# Patient Record
Sex: Female | Born: 1975 | Race: White | Hispanic: No | Marital: Married | Smoking: Former smoker
Health system: Southern US, Community
[De-identification: ages and names within clinical notes are randomized; demographics above are authoritative.]

## PROBLEM LIST (undated history)

## (undated) DIAGNOSIS — K219 Gastro-esophageal reflux disease without esophagitis: Secondary | ICD-10-CM

## (undated) DIAGNOSIS — K7689 Other specified diseases of liver: Secondary | ICD-10-CM

## (undated) DIAGNOSIS — R112 Nausea with vomiting, unspecified: Secondary | ICD-10-CM

## (undated) DIAGNOSIS — Z9889 Other specified postprocedural states: Secondary | ICD-10-CM

## (undated) DIAGNOSIS — F32A Depression, unspecified: Secondary | ICD-10-CM

## (undated) DIAGNOSIS — N809 Endometriosis, unspecified: Secondary | ICD-10-CM

## (undated) DIAGNOSIS — F419 Anxiety disorder, unspecified: Secondary | ICD-10-CM

## (undated) DIAGNOSIS — K317 Polyp of stomach and duodenum: Secondary | ICD-10-CM

## (undated) DIAGNOSIS — M1990 Unspecified osteoarthritis, unspecified site: Secondary | ICD-10-CM

## (undated) DIAGNOSIS — R51 Headache: Secondary | ICD-10-CM

## (undated) DIAGNOSIS — N6019 Diffuse cystic mastopathy of unspecified breast: Secondary | ICD-10-CM

## (undated) DIAGNOSIS — F329 Major depressive disorder, single episode, unspecified: Secondary | ICD-10-CM

## (undated) DIAGNOSIS — N83209 Unspecified ovarian cyst, unspecified side: Secondary | ICD-10-CM

## (undated) HISTORY — DX: Endometriosis, unspecified: N80.9

## (undated) HISTORY — DX: Other specified diseases of liver: K76.89

## (undated) HISTORY — PX: TONSILLECTOMY AND ADENOIDECTOMY: SHX28

## (undated) HISTORY — PX: DIAGNOSTIC LAPAROSCOPY: SUR761

## (undated) HISTORY — DX: Diffuse cystic mastopathy of unspecified breast: N60.19

## (undated) HISTORY — PX: MOUTH SURGERY: SHX715

## (undated) HISTORY — DX: Unspecified ovarian cyst, unspecified side: N83.209

## (undated) HISTORY — DX: Polyp of stomach and duodenum: K31.7

---

## 1998-02-23 ENCOUNTER — Emergency Department (HOSPITAL_COMMUNITY): Admission: EM | Admit: 1998-02-23 | Discharge: 1998-02-23 | Payer: Self-pay | Admitting: Emergency Medicine

## 1998-03-03 ENCOUNTER — Encounter: Admission: RE | Admit: 1998-03-03 | Discharge: 1998-06-01 | Payer: Self-pay | Admitting: Family Medicine

## 1998-11-10 ENCOUNTER — Other Ambulatory Visit: Admission: RE | Admit: 1998-11-10 | Discharge: 1998-11-10 | Payer: Self-pay | Admitting: Obstetrics and Gynecology

## 1998-11-30 ENCOUNTER — Ambulatory Visit (HOSPITAL_COMMUNITY): Admission: RE | Admit: 1998-11-30 | Discharge: 1998-11-30 | Payer: Self-pay | Admitting: Obstetrics and Gynecology

## 1999-11-30 ENCOUNTER — Other Ambulatory Visit: Admission: RE | Admit: 1999-11-30 | Discharge: 1999-11-30 | Payer: Self-pay | Admitting: Obstetrics and Gynecology

## 2000-10-08 ENCOUNTER — Inpatient Hospital Stay (HOSPITAL_COMMUNITY): Admission: AD | Admit: 2000-10-08 | Discharge: 2000-10-08 | Payer: Self-pay | Admitting: Obstetrics and Gynecology

## 2000-12-07 ENCOUNTER — Inpatient Hospital Stay (HOSPITAL_COMMUNITY): Admission: AD | Admit: 2000-12-07 | Discharge: 2000-12-07 | Payer: Self-pay | Admitting: Obstetrics and Gynecology

## 2000-12-08 ENCOUNTER — Inpatient Hospital Stay (HOSPITAL_COMMUNITY): Admission: AD | Admit: 2000-12-08 | Discharge: 2000-12-08 | Payer: Self-pay | Admitting: Obstetrics and Gynecology

## 2001-01-18 ENCOUNTER — Inpatient Hospital Stay (HOSPITAL_COMMUNITY): Admission: AD | Admit: 2001-01-18 | Discharge: 2001-01-18 | Payer: Self-pay | Admitting: Obstetrics and Gynecology

## 2001-01-23 ENCOUNTER — Inpatient Hospital Stay (HOSPITAL_COMMUNITY): Admission: AD | Admit: 2001-01-23 | Discharge: 2001-01-26 | Payer: Self-pay | Admitting: Obstetrics and Gynecology

## 2001-01-23 ENCOUNTER — Encounter (INDEPENDENT_AMBULATORY_CARE_PROVIDER_SITE_OTHER): Payer: Self-pay | Admitting: Specialist

## 2001-03-02 ENCOUNTER — Other Ambulatory Visit: Admission: RE | Admit: 2001-03-02 | Discharge: 2001-03-02 | Payer: Self-pay | Admitting: Obstetrics and Gynecology

## 2002-03-11 ENCOUNTER — Other Ambulatory Visit: Admission: RE | Admit: 2002-03-11 | Discharge: 2002-03-11 | Payer: Self-pay | Admitting: Obstetrics and Gynecology

## 2003-10-12 ENCOUNTER — Other Ambulatory Visit: Admission: RE | Admit: 2003-10-12 | Discharge: 2003-10-12 | Payer: Self-pay | Admitting: Obstetrics and Gynecology

## 2005-02-18 ENCOUNTER — Other Ambulatory Visit: Admission: RE | Admit: 2005-02-18 | Discharge: 2005-02-18 | Payer: Self-pay | Admitting: Obstetrics and Gynecology

## 2009-09-07 ENCOUNTER — Encounter: Admission: RE | Admit: 2009-09-07 | Discharge: 2009-09-07 | Payer: Self-pay | Admitting: Obstetrics and Gynecology

## 2010-08-18 ENCOUNTER — Inpatient Hospital Stay (HOSPITAL_COMMUNITY): Admission: AD | Admit: 2010-08-18 | Discharge: 2010-08-19 | Payer: Self-pay | Admitting: Obstetrics and Gynecology

## 2010-08-18 ENCOUNTER — Ambulatory Visit: Payer: Self-pay | Admitting: Obstetrics & Gynecology

## 2010-10-10 ENCOUNTER — Inpatient Hospital Stay (HOSPITAL_COMMUNITY): Admission: AD | Admit: 2010-10-10 | Discharge: 2010-10-11 | Payer: Self-pay | Admitting: Obstetrics and Gynecology

## 2010-10-11 ENCOUNTER — Inpatient Hospital Stay (HOSPITAL_COMMUNITY): Admission: AD | Admit: 2010-10-11 | Discharge: 2010-10-11 | Payer: Self-pay | Admitting: Obstetrics and Gynecology

## 2010-11-05 ENCOUNTER — Inpatient Hospital Stay (HOSPITAL_COMMUNITY)
Admission: RE | Admit: 2010-11-05 | Discharge: 2010-11-09 | Payer: Self-pay | Source: Home / Self Care | Attending: Obstetrics and Gynecology | Admitting: Obstetrics and Gynecology

## 2010-11-09 ENCOUNTER — Encounter
Admission: RE | Admit: 2010-11-09 | Discharge: 2010-11-19 | Payer: Self-pay | Source: Home / Self Care | Attending: Obstetrics and Gynecology | Admitting: Obstetrics and Gynecology

## 2011-01-17 ENCOUNTER — Other Ambulatory Visit: Payer: Self-pay | Admitting: Obstetrics and Gynecology

## 2011-01-17 DIAGNOSIS — N6459 Other signs and symptoms in breast: Secondary | ICD-10-CM

## 2011-01-21 ENCOUNTER — Other Ambulatory Visit: Payer: Self-pay | Admitting: Obstetrics and Gynecology

## 2011-01-21 DIAGNOSIS — N6459 Other signs and symptoms in breast: Secondary | ICD-10-CM

## 2011-01-28 ENCOUNTER — Ambulatory Visit
Admission: RE | Admit: 2011-01-28 | Discharge: 2011-01-28 | Disposition: A | Discharge status: Home / Self Care | Payer: Self-pay | Source: Ambulatory Visit | Attending: Obstetrics and Gynecology | Admitting: Obstetrics and Gynecology

## 2011-01-28 ENCOUNTER — Other Ambulatory Visit: Payer: Self-pay | Admitting: Obstetrics and Gynecology

## 2011-01-28 DIAGNOSIS — N6459 Other signs and symptoms in breast: Secondary | ICD-10-CM

## 2011-02-04 LAB — CBC
HCT: 31.4 % — ABNORMAL LOW (ref 36.0–46.0)
HCT: 32.4 % — ABNORMAL LOW (ref 36.0–46.0)
HCT: 42.3 % (ref 36.0–46.0)
Hemoglobin: 11.1 g/dL — ABNORMAL LOW (ref 12.0–15.0)
Hemoglobin: 14.4 g/dL (ref 12.0–15.0)
Hemoglobin: 14.4 g/dL (ref 12.0–15.0)
MCH: 34.9 pg — ABNORMAL HIGH (ref 26.0–34.0)
MCH: 35.1 pg — ABNORMAL HIGH (ref 26.0–34.0)
MCH: 35.2 pg — ABNORMAL HIGH (ref 26.0–34.0)
MCH: 35.8 pg — ABNORMAL HIGH (ref 26.0–34.0)
MCHC: 33.9 g/dL (ref 30.0–36.0)
MCHC: 34 g/dL (ref 30.0–36.0)
MCHC: 34.3 g/dL (ref 30.0–36.0)
MCHC: 34.4 g/dL (ref 30.0–36.0)
MCV: 101.9 fL — ABNORMAL HIGH (ref 78.0–100.0)
MCV: 103 fL — ABNORMAL HIGH (ref 78.0–100.0)
MCV: 103.8 fL — ABNORMAL HIGH (ref 78.0–100.0)
MCV: 104.1 fL — ABNORMAL HIGH (ref 78.0–100.0)
Platelets: 100 K/uL — ABNORMAL LOW (ref 150–400)
Platelets: 114 10*3/uL — ABNORMAL LOW (ref 150–400)
Platelets: 137 K/uL — ABNORMAL LOW (ref 150–400)
Platelets: 151 10*3/uL (ref 150–400)
RBC: 3.12 MIL/uL — ABNORMAL LOW (ref 3.87–5.11)
RBC: 4.11 MIL/uL (ref 3.87–5.11)
RBC: 4.11 MIL/uL (ref 3.87–5.11)
RDW: 13.4 % (ref 11.5–15.5)
RDW: 13.6 % (ref 11.5–15.5)
RDW: 13.8 % (ref 11.5–15.5)
WBC: 11.4 K/uL — ABNORMAL HIGH (ref 4.0–10.5)
WBC: 12.7 K/uL — ABNORMAL HIGH (ref 4.0–10.5)

## 2011-02-04 LAB — SYPHILIS: RPR W/REFLEX TO RPR TITER AND TREPONEMAL ANTIBODIES, TRADITIONAL SCREENING AND DIAGNOSIS ALGORITHM: RPR Ser Ql: NONREACTIVE

## 2011-02-04 LAB — RPR: RPR Ser Ql: NONREACTIVE

## 2011-02-07 LAB — URINALYSIS, ROUTINE W REFLEX MICROSCOPIC
Bilirubin Urine: NEGATIVE
Ketones, ur: NEGATIVE mg/dL
Protein, ur: NEGATIVE mg/dL
Specific Gravity, Urine: <1.005 — ABNORMAL LOW (ref 1.005–1.030)
Urobilinogen, UA: 0.2 mg/dL (ref 0.0–1.0)

## 2011-02-07 LAB — URINE MICROSCOPIC-ADD ON

## 2011-02-07 LAB — FETAL FIBRONECTIN: Fetal Fibronectin: NEGATIVE

## 2011-04-12 NOTE — Discharge Summary (Signed)
[facility]  Patient:    [name], [name]                         MRN: [id number] Adm. Date:  [id number] Disc. Date: [id number] Attending:  Frederich Balding Dictator:   Danie Chandler, R.N.                           Discharge Summary  ADMISSION DIAGNOSES:          Intrauterine pregnancy at 37+ weeks with                               increased uterine activity and decreased                               platelets.  DISCHARGE DIAGNOSES:          Intrauterine pregnancy at 37+ weeks with                               increased uterine activity and decreased                               platelets.  Plus possible placental abruption.  PROCEDURE:                    On January 23, 2001, primary low transverse cesarean section.  REASON FOR ADMISSION:         The patient is a 35 year old married white female, gravida 1, para 0, who presented to the office complaining of increased edema.  The patient was sent to triage to rule out pregnancy-induced hypertension.  She was having uterine contractions with very little relaxation. It was noted that her platelet count was slightly low at 130,000. All other lab work was normal.  HOSPITAL COURSE:              The patient was admitted for hydration and continued observation.  The fetal heart rate at the initial evaluation was reactive with no decellerations.  An ultrasound revealed adequate amniotic fluid and no obvious abruption.  During the continued monitoring, the infant presented two episodes of fetal bradycardia into the 60s with slow recovery. The patient continued to have uterine contractions with very little relaxation.  Despite this, there was no cervical change.  There was no active bleeding noted, however, the patient did have a contraction pattern and physical findings suggestive of an abruption.  In view of the findings and the decellerations, the decision was made to proceed with a cesarean  section.  The patient was taken to the operating room and underwent the above named procedure without complications.  This was productive of a viable female infant with Apgars of 9 at one minute and 9 at five minutes and an arterial cord pH of 7.32.  Postoperatively on day #1, the patients hemoglobin was 12.5, hematocrit 35.1, white blood cell count 16.2, and platelets 148,000.  She was without complaints.  On postoperative day #2, she had good return of bowel function and was tolerating a regular diet.  She was also ambulating well without difficulty and had good pain control.  She was discharged home on postoperative day #  3.  CONDITION ON DISCHARGE:       Good.  DIET:                         Regular as tolerated.  ACTIVITY:                     No heavy lifting, no driving, and no vaginal entry.  FOLLOW-UP:                   She is to follow up in the office in one to two weeks for an incision check and she is to call for temperature greater than 100 degrees, persistent nausea and vomiting, heavy vaginal bleeding, and/or redness or drainage from the incision site.  DISCHARGE MEDICATIONS:        1. Prenatal vitamins one p.o. q.d.                               2. Tylox as directed by M.D. DD:  02/09/01 TD:  02/09/01 Job: [id number]

## 2011-04-12 NOTE — Op Note (Signed)
[facility]  Patient:    [name], [name]                         MRN: [id number] Proc. Date: 01/23/01 Adm. Date:  [id number] Attending:  Frederich Balding                           Operative Report  PREOPERATIVE DIAGNOSIS:       Intrauterine pregnancy at 37+ weeks with possible placental abruption.  POSTOPERATIVE DIAGNOSIS:      Intrauterine pregnancy at 37+ weeks with possible placental abruption.  PROCEDURE:                    Low transverse cesarean section.  SURGEON:                      Juluis Mire, M.D.  ANESTHESIA:                   Spinal.  ESTIMATED BLOOD LOSS:         800 cc.  PACKS AND DRAINS:             None.  INTRAOPERATIVE BLOOD REPLACEMENT:                  None.  COMPLICATIONS:                None.  INDICATIONS:                  A 35 year old primigravida, married white female, who presented to the office complaining of increased edema. She was sent to triage to rule out pregnancy induced hypertension. She was having tetanic uterine contractions with very little relaxation. It was noted that her platelet count was slightly low at 130,000. All other lab work was normal. She was admitted for hydration and continued observation. Fetal heart rate at the initial evaluation was reactive with no decelerations. Ultrasound revealed adequate amniotic fluid and no obvious abruption. During the continued monitoring, the infant presented two episodes of fetal bradycardia into the 60s with slow recovery. She continued to have tetanic contractions with very little relaxation; however, despite this there was no cervical change. There was no active bleeding noted; however, she had a contraction pattern and physical findings suggestive of an abruption. In view of these findings and the decelerations, we decided to proceed with cesarean section. The risks were discussed including the risks of infection, the risk of hemorrhage  to necessitate transfusion with the risk of AIDS or hepatitis, the risk of injury to adjacent organs including bladder, bowel, or ureters that could require further exploratory surgery, risk of deep venous thrombosis and pulmonary embolus.  DESCRIPTION OF PROCEDURE:     The patient was taken to the OR and placed in supine position, left lateral tilt. After satisfactory level of spinal anesthesia was obtained, the abdomen was prepped out with Betadine and draped as a sterile field. A low transverse skin incision was made with the knife and carried through the subcutaneous tissue. The anterior rectus fascia was entered sharply and the fascial incision extended laterally. The fascia was taken off the muscle superiorly and inferiorly. The rectus muscles were separated in the midline. The peritoneum was entered sharply and the incision in the peritoneum extended both superiorly and inferiorly. A low transverse bladder flap was easily developed. A low transverse uterine incision was  begun with a knife and extended laterally using ______ traction. The infant was in the vertex presentation, was delivered with the elevation of the head and fundal pressure. Nuchal cord x 1 was noted. Amniotic fluid was clear. The infant was a viable female who weighed 6 pounds 15 ounces, Apgars were 9 and 9. Umbilical artery pH was 7.32. The placenta was then delivered. There was a bit of a separation right at the lower edge of the placenta with minimal bleeding but a definite separation. The placenta was sent for pathological review. The uterus was then closed with interlocking sutures of 0 chromic using two layer closure technique. We had good hemostasis. Tubes and ovaries are visualized and noted to be unremarkable. Urine output remained clear and adequate. Pelvic cavity was thoroughly irrigated. We had good hemostasis. Muscles were approximated with a running suture of 3-0 Vicryl. The fascia was closed with  a running suture of 0 Panacryl. The skin was closed with staples and Steri-Strips. Sponge, instrument, and needle correct reported as correct by circulating nurse x 2. Foley catheter draining clear at time of closure. The patient tolerated the procedure well and was returned to recovery room in good condition. DD:  01/23/01 TD:  01/25/01 Job: [id number]

## 2011-04-12 NOTE — Op Note (Signed)
[facility]  Patient:    [name], [name]                           MRN: [id number] Proc. Date: 01/23/01 Attending:  Juluis Mire, M.D.                           Operative Report  PREOPERATIVE DIAGNOSIS:       Intrauterine pregnancy at 37+ weeks with possible placental abruption.  POSTOPERATIVE DIAGNOSIS:      Intrauterine pregnancy at 37+ weeks with possible placental abruption.  OPERATION:                    Low transverse cesarean section.  SURGEON:                      Juluis Mire, M.D.  ASSISTANT:  ANESTHESIA:                   Spinal.  ESTIMATED BLOOD LOSS:         800 cc.  PACKS AND DRAINS:             None.  BLOOD REPLACED:               None.  COMPLICATIONS:                None.  INDICATIONS:                  A 35 year old primigravida married white female who presented to the office complaining of increased edema.  She was sent to triage to rule out pregnancy-induced hypertension.  She was having titanic uterine contractions with very little relaxation.  It was noted that her platelet count was slightly low at 130,000.  All other lab work was normal. She was admitted for hydration and continued observation.  Fetal heart rate at the initial evaluation was reactive with no decellerations.  Ultrasound revealed adequate amniotic fluid and no obvious abruption.  During the continuous monitoring, the infant presented two episodes of fetal bradycardia into the 60s with slow recovery.  She continued to have titanic contractions with very little relaxation.  However, despite this there was no cervical change.  There was no active bleeding noted, however, she had a contraction pattern and physical findings suggestive of an abruption.  In view of these findings and the decellerations, we decided to proceed with cesarean section. The risks were discussed including the risks of infection, the risks of hemorrhage that could necessitate  transfusion with the risks of AIDS or hepatitis.  The risks of injury to adjacent organs including bladder, bowel, or ureters that could require further exploratory surgery, risk of deep venous thrombosis and pulmonary embolus.  DESCRIPTION OF PROCEDURE:     The patient was taken the OR and placed in the supine position with a left lateral tilt.  After a satisfactory level of spinal anesthesia was obtained, the abdomen was prepped with Betadine and draped as a sterile field.  A low transverse skin incision was made with the knife and carried through the subcutaneous tissue.  The anterior rectus fascia was entered sharply and incision to fascia extended laterally.  The fascia was taken off the muscles superiorly and inferiorly.  The rectus muscles were separated in the midline.  The perineum was entered sharply and incision to perineum extended both superiorly and inferiorly.  A low transverse bladder flap was easily developed.  A low transverse uterine incision was begun with the knife and extended laterally using manual traction.  The infant presented in the vertex presentation and was delivered with elevation of the head and fundal pressure.  Nuchal cord x 1 was noted.  Amniotic fluid was clear.  The infant was a viable female who weighed 6 pounds 15 ounces, Apgars were 9 over 9, umbilical artery pH was 7.32.  The placenta was then delivered.  There was a bit of a separation right at the lower edge of the placenta with minimal bleeding, but a definite separation.  The uterus was sent for pathological review.  The uterus was then closed with running locking suture of 0 chromic using two layer closure technique.  We had good hemostasis.  Tubes and ovaries were visualized and noted to be unremarkable.  Urine output remained clear and adequate.  Pelvic cavity was thoroughly irrigated.  We had good hemostasis. Muscles were reapproximated with running suture of 3-0 Vicryl.  The fascia was closed  with a running suture of 0 Panocryl.  The skin was closed with staples and Steri-Strips.  Sponge, needle, and instrument counts were correct by circulating nurse x 2.  Foley catheter remained clear at the time of closure. The patient tolerated the procedure well and was returned to the recovery room in good condition. DD:  01/23/01 TD:  01/26/01 Job: [id number]

## 2011-05-08 ENCOUNTER — Other Ambulatory Visit: Payer: Self-pay | Admitting: Family Medicine

## 2011-05-08 DIAGNOSIS — R1032 Left lower quadrant pain: Secondary | ICD-10-CM

## 2011-05-10 ENCOUNTER — Ambulatory Visit
Admission: RE | Admit: 2011-05-10 | Discharge: 2011-05-10 | Disposition: A | Discharge status: Home / Self Care | Payer: 59 | Source: Ambulatory Visit | Attending: Family Medicine | Admitting: Family Medicine

## 2011-05-10 ENCOUNTER — Other Ambulatory Visit: Payer: Self-pay | Admitting: Family Medicine

## 2011-05-10 DIAGNOSIS — R1032 Left lower quadrant pain: Secondary | ICD-10-CM

## 2011-05-10 MED ORDER — IOHEXOL 300 MG/ML  SOLN
100.0000 mL | Freq: Once | INTRAMUSCULAR | Status: AC | PRN
  Administered 2011-05-10: 100 mL via INTRAVENOUS

## 2012-03-31 ENCOUNTER — Encounter (HOSPITAL_COMMUNITY): Payer: Self-pay | Admitting: *Deleted

## 2012-04-01 ENCOUNTER — Encounter (HOSPITAL_COMMUNITY): Payer: Self-pay | Admitting: Pharmacist

## 2012-04-07 NOTE — H&P (Signed)
[name] is an 36 y.o. female G 2 P 2presents for incisional revision.  Patient since last cesarean section has been having a sharp pain in the left side of the incision.  Attempt at revision in office and injections have been unsuccessful.   Presents for attempt or incision revesion.   Pertinent Gynecological History: Menses: flow is light Bleeding: normal Contraception: OCP (estrogen/progesterone) DES exposure: denies Blood transfusions: none Sexually transmitted diseases: no past history Previous GYN Procedures: laparoscopy and perineoplasty  Last mammogram: na Date: na Last pap: normal Date: 3-16 OB History: G2, P2   Menstrual History: Menarche age: 36 Patient's last menstrual period was 03/11/2012.    Past Medical History  Diagnosis Date  . Depression   . Anxiety   . Headache     stress related - otc meds prn  . GERD (gastroesophageal reflux disease)     no meds - diet controlled    Past Surgical History  Procedure Date  . Tonsillectomy   . Tonsillectomy and adenoidectomy '  . Diagnostic laparoscopy     x 3 surg for ovarian cysts/endometriosis  . Cesarean section 2002, 2011    x 2  . Upper gastrointestinal endoscopy     History reviewed. No pertinent family history.  Social History:  reports that she quit smoking about 2 years ago. Her smoking use included Cigarettes. She has a 10 pack-year smoking history. She has never used smokeless tobacco. She reports that she does not drink alcohol or use illicit drugs.  Allergies:  Allergies  Allergen Reactions  . Nexium (Esomeprazole Magnesium) Other (See Comments)    Severe migraines  . Neosporin (Neomycin-Bacitracin Zn-Polymyx) Swelling    As child, eyes swollen shut per patient    No prescriptions prior to admission    Review of Systems  All other systems reviewed and are negative.    Height 5\' 3"  (1.6 m), weight 61.236 kg (135 lb), last menstrual period 03/11/2012. Physical Exam  Constitutional: She  is oriented to person, place, and time. She appears well-developed and well-nourished.  HENT:  Head: Normocephalic.  Mouth/Throat: Oropharynx is clear and moist.  Eyes: Conjunctivae and EOM are normal. Pupils are equal, round, and reactive to light.  Neck: Normal range of motion. No thyromegaly present.  Cardiovascular: Normal rate, regular rhythm and normal heart sounds.   Respiratory: Effort normal and breath sounds normal.  GI: Soft. Bowel sounds are normal. There is tenderness.  Genitourinary: Vagina normal and uterus normal.  Neurological: She is alert and oriented to person, place, and time. She has normal reflexes.    No results found for this or any previous visit (from the past 24 hour(s)).  No results found.  Assessment/Plan: Incisional pain.  procede with revision.  Risk include infection.  Bleeding that could require transfusion with risk of aids or hepatitis.  Injury to adjacent organs. DVT and PE.  Also chance pain may persisit .  [name] S 04/07/2012, 1:07 PM

## 2012-04-08 ENCOUNTER — Ambulatory Visit (HOSPITAL_COMMUNITY)
Admission: RE | Admit: 2012-04-08 | Discharge: 2012-04-08 | Disposition: A | Discharge status: Home / Self Care | Payer: 59 | Source: Ambulatory Visit | Attending: Obstetrics and Gynecology | Admitting: Obstetrics and Gynecology

## 2012-04-08 ENCOUNTER — Encounter (HOSPITAL_COMMUNITY): Payer: Self-pay | Admitting: Anesthesiology

## 2012-04-08 ENCOUNTER — Ambulatory Visit (HOSPITAL_COMMUNITY): Payer: 59 | Admitting: Anesthesiology

## 2012-04-08 ENCOUNTER — Encounter (HOSPITAL_COMMUNITY): Payer: Self-pay | Admitting: *Deleted

## 2012-04-08 ENCOUNTER — Encounter (HOSPITAL_COMMUNITY)
Admission: RE | Disposition: A | Discharge status: Home / Self Care | Payer: Self-pay | Source: Ambulatory Visit | Attending: Obstetrics and Gynecology

## 2012-04-08 DIAGNOSIS — L7682 Other postprocedural complications of skin and subcutaneous tissue: Secondary | ICD-10-CM

## 2012-04-08 DIAGNOSIS — L905 Scar conditions and fibrosis of skin: Secondary | ICD-10-CM | POA: Insufficient documentation

## 2012-04-08 HISTORY — DX: Headache: R51

## 2012-04-08 HISTORY — DX: Depression, unspecified: F32.A

## 2012-04-08 HISTORY — DX: Major depressive disorder, single episode, unspecified: F32.9

## 2012-04-08 HISTORY — DX: Anxiety disorder, unspecified: F41.9

## 2012-04-08 HISTORY — DX: Gastro-esophageal reflux disease without esophagitis: K21.9

## 2012-04-08 HISTORY — PX: SCAR REVISION: SHX5285

## 2012-04-08 LAB — CBC
Hemoglobin: 14.3 g/dL (ref 12.0–15.0)
MCH: 32.1 pg (ref 26.0–34.0)
MCV: 97.1 fL (ref 78.0–100.0)
RBC: 4.45 MIL/uL (ref 3.87–5.11)

## 2012-04-08 SURGERY — REVISION, SCAR
Anesthesia: General | Site: Abdomen | Wound class: Clean

## 2012-04-08 MED ORDER — FENTANYL CITRATE 0.05 MG/ML IJ SOLN
25.0000 ug | INTRAMUSCULAR | Status: DC | PRN
  Administered 2012-04-08 (×2): 25 ug via INTRAVENOUS

## 2012-04-08 MED ORDER — CEFAZOLIN SODIUM 1-5 GM-% IV SOLN
1.0000 g | INTRAVENOUS | Status: AC
  Administered 2012-04-08: 1 g via INTRAVENOUS

## 2012-04-08 MED ORDER — MIDAZOLAM HCL 2 MG/2ML IJ SOLN: 0.5000 mg | Freq: Once | INTRAMUSCULAR | Status: DC | PRN

## 2012-04-08 MED ORDER — FAMOTIDINE 20 MG PO TABS
20.0000 mg | ORAL_TABLET | Freq: Every day | ORAL | Status: AC
  Administered 2012-04-08: 20 mg via ORAL

## 2012-04-08 MED ORDER — PROMETHAZINE HCL 25 MG/ML IJ SOLN: 6.2500 mg | INTRAMUSCULAR | Status: DC | PRN

## 2012-04-08 MED ORDER — MEPERIDINE HCL 25 MG/ML IJ SOLN: 6.2500 mg | INTRAMUSCULAR | Status: DC | PRN

## 2012-04-08 MED ORDER — BUPIVACAINE HCL 0.5 % IJ SOLN
INTRAMUSCULAR | Status: DC | PRN
  Administered 2012-04-08: 30 mL

## 2012-04-08 MED ORDER — PANTOPRAZOLE SODIUM 40 MG PO TBEC
DELAYED_RELEASE_TABLET | ORAL | Status: AC
  Filled 2012-04-08: qty 1

## 2012-04-08 MED ORDER — OXYCODONE-ACETAMINOPHEN 5-500 MG PO CAPS: 1.0000 | ORAL_CAPSULE | ORAL | Status: AC | PRN

## 2012-04-08 MED ORDER — BUPIVACAINE HCL (PF) 0.5 % IJ SOLN
INTRAMUSCULAR | Status: AC
  Filled 2012-04-08: qty 30

## 2012-04-08 MED ORDER — MIDAZOLAM HCL 5 MG/5ML IJ SOLN
INTRAMUSCULAR | Status: DC | PRN
  Administered 2012-04-08: 2 mg via INTRAVENOUS

## 2012-04-08 MED ORDER — LIDOCAINE-EPINEPHRINE 0.5-1:200000 % IJ SOLN
INTRAMUSCULAR | Status: AC
  Filled 2012-04-08: qty 1

## 2012-04-08 MED ORDER — KETOROLAC TROMETHAMINE 30 MG/ML IJ SOLN
15.0000 mg | Freq: Once | INTRAMUSCULAR | Status: AC | PRN
  Administered 2012-04-08: 30 mg via INTRAVENOUS

## 2012-04-08 MED ORDER — BACITRACIN ZINC 500 UNIT/GM EX OINT
TOPICAL_OINTMENT | CUTANEOUS | Status: AC
  Filled 2012-04-08: qty 15

## 2012-04-08 MED ORDER — LIDOCAINE HCL (CARDIAC) 20 MG/ML IV SOLN
INTRAVENOUS | Status: DC | PRN
  Administered 2012-04-08: 50 mg via INTRAVENOUS

## 2012-04-08 MED ORDER — LIDOCAINE HCL (CARDIAC) 20 MG/ML IV SOLN
INTRAVENOUS | Status: AC
  Filled 2012-04-08: qty 5

## 2012-04-08 MED ORDER — MIDAZOLAM HCL 2 MG/2ML IJ SOLN
INTRAMUSCULAR | Status: AC
  Filled 2012-04-08: qty 2

## 2012-04-08 MED ORDER — FENTANYL CITRATE 0.05 MG/ML IJ SOLN
INTRAMUSCULAR | Status: DC | PRN
  Administered 2012-04-08: 50 ug via INTRAVENOUS
  Administered 2012-04-08: 100 ug via INTRAVENOUS

## 2012-04-08 MED ORDER — PROPOFOL 10 MG/ML IV EMUL
INTRAVENOUS | Status: AC
  Filled 2012-04-08: qty 20

## 2012-04-08 MED ORDER — LACTATED RINGERS IV SOLN
INTRAVENOUS | Status: DC
  Administered 2012-04-08 (×3): via INTRAVENOUS

## 2012-04-08 MED ORDER — CEFAZOLIN SODIUM 1-5 GM-% IV SOLN
INTRAVENOUS | Status: AC
  Filled 2012-04-08: qty 50

## 2012-04-08 MED ORDER — ACETAMINOPHEN 325 MG PO TABS: 325.0000 mg | ORAL_TABLET | ORAL | Status: DC | PRN

## 2012-04-08 MED ORDER — FENTANYL CITRATE 0.05 MG/ML IJ SOLN
INTRAMUSCULAR | Status: AC
  Administered 2012-04-08: 25 ug via INTRAVENOUS
  Filled 2012-04-08: qty 2

## 2012-04-08 MED ORDER — FAMOTIDINE 20 MG PO TABS
ORAL_TABLET | ORAL | Status: AC
  Administered 2012-04-08: 20 mg via ORAL
  Filled 2012-04-08: qty 1

## 2012-04-08 MED ORDER — PROPOFOL 10 MG/ML IV EMUL
INTRAVENOUS | Status: DC | PRN
  Administered 2012-04-08: 180 mg via INTRAVENOUS

## 2012-04-08 MED ORDER — KETOROLAC TROMETHAMINE 30 MG/ML IJ SOLN
INTRAMUSCULAR | Status: AC
  Administered 2012-04-08: 30 mg via INTRAVENOUS
  Filled 2012-04-08: qty 1

## 2012-04-08 MED ORDER — ONDANSETRON HCL 4 MG/2ML IJ SOLN
INTRAMUSCULAR | Status: DC | PRN
  Administered 2012-04-08: 4 mg via INTRAVENOUS

## 2012-04-08 MED ORDER — FENTANYL CITRATE 0.05 MG/ML IJ SOLN
INTRAMUSCULAR | Status: AC
  Filled 2012-04-08: qty 5

## 2012-04-08 MED ORDER — ONDANSETRON HCL 4 MG/2ML IJ SOLN
INTRAMUSCULAR | Status: AC
  Filled 2012-04-08: qty 2

## 2012-04-08 SURGICAL SUPPLY — 24 items
BLADE SURG 15 STRL LF C SS BP (BLADE) ×1 IMPLANT
BLADE SURG 15 STRL SS (BLADE) ×2
CANISTER SUCTION 1200CC (MISCELLANEOUS) ×1 IMPLANT
CATH ROBINSON RED A/P 12FR (CATHETERS) ×1 IMPLANT
CLOTH BEACON ORANGE TIMEOUT ST (SAFETY) ×2 IMPLANT
DECANTER SPIKE VIAL GLASS SM (MISCELLANEOUS) ×1 IMPLANT
DRESSING TELFA 8X3 (GAUZE/BANDAGES/DRESSINGS) ×1 IMPLANT
ELECT NDL TIP 2.8 STRL (NEEDLE) IMPLANT
ELECT NEEDLE TIP 2.8 STRL (NEEDLE) IMPLANT
ELECT REM PT RETURN 9FT ADLT (ELECTROSURGICAL) ×2
ELECTRODE REM PT RTRN 9FT ADLT (ELECTROSURGICAL) ×1 IMPLANT
GLOVE BIO SURGEON STRL SZ7 (GLOVE) ×4 IMPLANT
GLOVE BIOGEL PI IND STRL 7.0 (GLOVE) IMPLANT
GLOVE BIOGEL PI INDICATOR 7.0 (GLOVE) ×1
GLOVE SURG SS PI 6.5 STRL IVOR (GLOVE) ×1 IMPLANT
GOWN PREVENTION PLUS LG XLONG (DISPOSABLE) ×4 IMPLANT
NEEDLE HYPO 22GX1.5 SAFETY (NEEDLE) ×1 IMPLANT
PACK ABDOMINAL MINOR (CUSTOM PROCEDURE TRAY) ×2 IMPLANT
PAD ABD 7.5X8 STRL (GAUZE/BANDAGES/DRESSINGS) ×1 IMPLANT
SPONGE GAUZE 4X4 12PLY (GAUZE/BANDAGES/DRESSINGS) ×1 IMPLANT
SPONGE LAP 18X18 X RAY DECT (DISPOSABLE) IMPLANT
SYR CONTROL 10ML LL (SYRINGE) ×1 IMPLANT
TAPE CLOTH SURG 4X10 WHT LF (GAUZE/BANDAGES/DRESSINGS) ×1 IMPLANT
TOWEL OR 17X24 6PK STRL BLUE (TOWEL DISPOSABLE) ×4 IMPLANT

## 2012-04-08 NOTE — Op Note (Signed)
NAMECELLIE, DARDIS                  ACCOUNT NO.:  [id number]  MEDICAL RECORD NO.:  [id number]  LOCATION:  [facility]                          FACILITY:  [facility]  PHYSICIAN:  Juluis Mire, M.D.   DATE OF BIRTH:  Jan 01, 1976  DATE OF PROCEDURE: DATE OF DISCHARGE:                              OPERATIVE REPORT   PREOPERATIVE DIAGNOSIS:  Incisional pain.  POSTOPERATIVE DIAGNOSIS:  Incisional pain.  PROCEDURES:  Incisional revision.  SURGEON:  Juluis Mire, MD.  ANESTHESIA:  General.  ESTIMATED BLOOD LOSS:  Minimal.  PACKS AND DRAINS:  None.  INTRAOPERATIVE BLOOD PLACED:  None.  COMPLICATIONS:  None.  INDICATION:  As in H and P.  PROCEDURE IN DETAIL:  The patient was taken to the OR and placed in supine position.  After satisfactory level of general anesthesia, the abdomen was prepped out with Betadine and draped as a sterile field.  An area of incisional discomfort had been previously marked.  We excised the skin over this area.  We then excised the subcutaneous tissue under this area.  Some of it was thickened and scarred.  I did not find any residual sutures or any signs of infection.  We dissected all the way down to the fascia, removing the subcutaneous tissue.  At this point in time, we broke up any scarring around this area.  We injected the fascia and the subfascial area with 0.5% Marcaine.  We obtained good hemostasis.  The subcutaneous fat was closed with two sutures of 3-0 Rapide.  The skin was closed with a running subcuticular of 4-0 Vicryl and Dermabond.  We had good approximation.  No active bleeding or complications.  At this point of time, sponges and needle count was reported as correct by circulating nurse.  After the patient was extubated, she was transferred to the recovery room in good condition.     Juluis Mire, M.D.     JSM/MEDQ  D:  04/08/2012  T:  04/08/2012  Job:  [id number]

## 2012-04-08 NOTE — Op Note (Signed)
Patient name  [name], [name] DICTATION#  [id number] CSN# [id number]  [facility], MD 04/08/2012 9:10 AM

## 2012-04-08 NOTE — Transfer of Care (Signed)
Immediate Anesthesia Transfer of Care Note  Patient: [name]  Procedure(s) Performed: Procedure(s) (LRB): SCAR REVISION (N/A)  Patient Location: PACU  Anesthesia Type: General  Level of Consciousness: sedated  Airway & Oxygen Therapy: Patient Spontanous Breathing and Patient connected to nasal cannula oxygen  Post-op Assessment: Report given to PACU RN  Post vital signs: Reviewed and stable  Complications: No apparent anesthesia complications

## 2012-04-08 NOTE — Anesthesia Postprocedure Evaluation (Signed)
Anesthesia Post Note  Patient: [name]  Procedure(s) Performed: Procedure(s) (LRB): SCAR REVISION (N/A)  Anesthesia type: GA  Patient location: PACU  Post pain: Pain level controlled  Post assessment: Post-op Vital signs reviewed  Last Vitals:  Filed Vitals:   04/08/12 1015  BP: 119/76  Pulse: 60  Temp: 36.9 C  Resp: 20    Post vital signs: Reviewed  Level of consciousness: sedated  Complications: No apparent anesthesia complications

## 2012-04-08 NOTE — Anesthesia Preprocedure Evaluation (Signed)
Anesthesia Evaluation  Patient identified by MRN, date of birth, ID band Patient awake    Reviewed: Allergy & Precautions, H&P , Patient's Chart, lab work & pertinent test results, reviewed documented beta blocker date and time   History of Anesthesia Complications Negative for: history of anesthetic complications  Airway Mallampati: II TM Distance: >3 FB Neck ROM: full    Dental No notable dental hx.    Pulmonary neg pulmonary ROS,  breath sounds clear to auscultation  Pulmonary exam normal       Cardiovascular Exercise Tolerance: Good negative cardio ROS  Rhythm:regular Rate:Normal     Neuro/Psych negative neurological ROS  negative psych ROS   GI/Hepatic negative GI ROS, Neg liver ROS, GERD-  ,  Endo/Other  negative endocrine ROS  Renal/GU negative Renal ROS     Musculoskeletal   Abdominal   Peds  Hematology negative hematology ROS (+)   Anesthesia Other Findings   Reproductive/Obstetrics negative OB ROS                           Anesthesia Physical Anesthesia Plan  ASA: I  Anesthesia Plan: General LMA   Post-op Pain Management:    Induction:   Airway Management Planned:   Additional Equipment:   Intra-op Plan:   Post-operative Plan:   Informed Consent: I have reviewed the patients History and Physical, chart, labs and discussed the procedure including the risks, benefits and alternatives for the proposed anesthesia with the patient or authorized representative who has indicated his/her understanding and acceptance.   Dental Advisory Given  Plan Discussed with: CRNA, Surgeon and Anesthesiologist  Anesthesia Plan Comments:         Anesthesia Quick Evaluation

## 2012-04-08 NOTE — Discharge Instructions (Signed)
Scar Minimization You will have a scar anytime you have surgery and a cut is made in the skin or you have something removed from your skin (mole, skin cancer, cyst). Although scars are unavoidable following surgery, there are ways to minimize their appearance. It is important to follow all the instructions you receive from your caregiver about wound care. How your wound heals will influence the appearance of your scar. If you do not follow the wound care instructions as directed, complications such as infection may occur. Wound instructions include keeping the wound clean, moist, and not letting the wound form a scab. Some people form scars that are raised and lumpy (hypertrophic) or larger than the initial wound (keloidal).  MAY TAKE IBUPROFEN/MOTRIN PRODUCTS AFTER 3:45pm AS NEEDED FOR PAIN  HOME CARE INSTRUCTIONS   Follow wound care instructions as directed.   Keep the wound clean by washing it with soap and water.   Keep the wound moist with provided antibiotic cream or petroleum jelly until completely healed. Moisten twice a day for about 2 weeks.   Get stitches (sutures) taken out at the scheduled time.   Avoid touching or manipulating your wound unless needed. Wash your hands thoroughly before and after touching your wound.   Follow all restrictions such as limits on exercise or work. This depends on where your scar is located.   Keep the scar protected from sunburn. Cover the scar with sunscreen/sunblock with SPF 30 or higher.   Gently massage the scar using a circular motion to help minimize the appearance of the scar. Do this only after the wound has closed and all the sutures have been removed.   For hypertrophic or keloidal scars, there are several ways to treat and minimize their appearance. Methods include compression therapy, intralesional corticosteroids, laser therapy, or surgery. These methods are performed by your caregiver.    Remember that the scar may appear lighter or  darker than your normal skin color. This difference in color should even out with time.  SEEK MEDICAL CARE IF:   You have a fever.   You develop signs of infection such as pain, redness, pus, and warmth.   You have questions or concerns.    Document Released: 05/01/2010 Document Revised: 10/31/2011 Document Reviewed: 05/01/2010 [facility] Patient Information 2012 [place], [place].

## 2012-04-08 NOTE — Brief Op Note (Signed)
04/08/2012  9:09 AM  PATIENT:  [name]  36 y.o. female  PRE-OPERATIVE DIAGNOSIS:  incisional pain  POST-OPERATIVE DIAGNOSIS:  incisional pain  PROCEDURE:  Procedure(s) (LRB): SCAR REVISION (N/A)  SURGEON:  Surgeon(s) and Role:    * Juluis Mire, MD - Primary  PHYSICIAN ASSISTANT:   ASSISTANTS: none   ANESTHESIA:   general  EBL:  Total I/O In: 1000 [I.V.:1000] Out: 10 [Blood:10]  BLOOD ADMINISTERED:none  DRAINS: none   LOCAL MEDICATIONS USED:  MARCAINE     SPECIMEN:  No Specimen  DISPOSITION OF SPECIMEN:  N/A  COUNTS:  YES  TOURNIQUET:  * No tourniquets in log *  DICTATION: .Other Dictation: Dictation Number [id number]  PLAN OF CARE: Discharge to home after PACU  PATIENT DISPOSITION:  PACU - hemodynamically stable.   Delay start of Pharmacological VTE agent (>24hrs) due to surgical blood loss or risk of bleeding: no

## 2012-04-08 NOTE — H&P (Signed)
  History and physical exam unchanged 

## 2012-04-09 ENCOUNTER — Encounter (HOSPITAL_COMMUNITY): Payer: Self-pay | Admitting: Obstetrics and Gynecology

## 2013-12-24 ENCOUNTER — Other Ambulatory Visit: Payer: Self-pay | Admitting: Family Medicine

## 2013-12-24 DIAGNOSIS — R1011 Right upper quadrant pain: Secondary | ICD-10-CM

## 2013-12-27 ENCOUNTER — Ambulatory Visit
Admission: RE | Admit: 2013-12-27 | Discharge: 2013-12-27 | Disposition: A | Discharge status: Home / Self Care | Payer: 59 | Source: Ambulatory Visit | Attending: Family Medicine | Admitting: Family Medicine

## 2013-12-27 DIAGNOSIS — R1011 Right upper quadrant pain: Secondary | ICD-10-CM

## 2013-12-29 ENCOUNTER — Other Ambulatory Visit (HOSPITAL_COMMUNITY): Payer: Self-pay | Admitting: Gastroenterology

## 2013-12-29 DIAGNOSIS — R1011 Right upper quadrant pain: Secondary | ICD-10-CM

## 2014-01-03 ENCOUNTER — Encounter (HOSPITAL_COMMUNITY)
Admission: RE | Admit: 2014-01-03 | Discharge: 2014-01-03 | Disposition: A | Discharge status: Home / Self Care | Payer: 59 | Source: Ambulatory Visit | Attending: Gastroenterology | Admitting: Gastroenterology

## 2014-01-03 DIAGNOSIS — R1011 Right upper quadrant pain: Secondary | ICD-10-CM | POA: Insufficient documentation

## 2014-01-03 MED ORDER — SINCALIDE 5 MCG IJ SOLR
INTRAMUSCULAR | Status: AC
  Administered 2014-01-03: 3.07 ug via INTRAVENOUS
  Filled 2014-01-03: qty 5

## 2014-01-03 MED ORDER — TECHNETIUM TC 99M MEBROFENIN IV KIT
5.0000 | PACK | Freq: Once | INTRAVENOUS | Status: AC | PRN
  Administered 2014-01-03: 5 via INTRAVENOUS

## 2014-01-03 MED ORDER — STERILE WATER FOR INJECTION IJ SOLN
INTRAMUSCULAR | Status: AC
  Administered 2014-01-03: 10 mL
  Filled 2014-01-03: qty 10

## 2014-01-06 ENCOUNTER — Telehealth: Payer: Self-pay | Admitting: *Deleted

## 2014-01-06 NOTE — Telephone Encounter (Signed)
Patient with recent history with Dr Penelope Coop for severe abdominal pain. Dr Penelope Coop did U/S with findings of only a liver cyst. HIDA was negative. No further testing has been completed. Patient would like to see Dr Olevia Perches to get her opinion. Dr Olevia Perches, if we get Dr Estell Harpin records, would you like to accept patient or should she continue with Dr Penelope Coop?

## 2014-01-06 NOTE — Telephone Encounter (Signed)
I will, after I review the records.

## 2014-01-07 ENCOUNTER — Encounter: Payer: Self-pay | Admitting: *Deleted

## 2014-01-07 NOTE — Telephone Encounter (Signed)
Patient has an appointment scheduled for 01/11/14 @ 9 am with Dr Olevia Perches. Patient verbalizes understanding.

## 2014-01-10 ENCOUNTER — Ambulatory Visit (INDEPENDENT_AMBULATORY_CARE_PROVIDER_SITE_OTHER): Payer: 59

## 2014-01-10 ENCOUNTER — Ambulatory Visit (INDEPENDENT_AMBULATORY_CARE_PROVIDER_SITE_OTHER): Payer: 59 | Admitting: Internal Medicine

## 2014-01-10 ENCOUNTER — Encounter: Payer: Self-pay | Admitting: Internal Medicine

## 2014-01-10 VITALS — BP 120/80 | HR 68 | Ht 63.0 in | Wt 136.0 lb

## 2014-01-10 DIAGNOSIS — R1011 Right upper quadrant pain: Secondary | ICD-10-CM

## 2014-01-10 DIAGNOSIS — R195 Other fecal abnormalities: Secondary | ICD-10-CM

## 2014-01-10 DIAGNOSIS — R109 Unspecified abdominal pain: Secondary | ICD-10-CM

## 2014-01-10 DIAGNOSIS — R160 Hepatomegaly, not elsewhere classified: Secondary | ICD-10-CM

## 2014-01-10 LAB — FERRITIN: FERRITIN: 46.8 ng/mL (ref 10.0–291.0)

## 2014-01-10 LAB — CBC WITH DIFFERENTIAL/PLATELET
BASOS PCT: 0.5 % (ref 0.0–3.0)
Basophils Absolute: 0 10*3/uL (ref 0.0–0.1)
EOS ABS: 0.2 10*3/uL (ref 0.0–0.7)
EOS PCT: 2.2 % (ref 0.0–5.0)
HEMATOCRIT: 45.8 % (ref 36.0–46.0)
HEMOGLOBIN: 15 g/dL (ref 12.0–15.0)
LYMPHS ABS: 3.4 10*3/uL (ref 0.7–4.0)
Lymphocytes Relative: 42 % (ref 12.0–46.0)
MCHC: 32.8 g/dL (ref 30.0–36.0)
MCV: 98.2 fl (ref 78.0–100.0)
MONO ABS: 0.7 10*3/uL (ref 0.1–1.0)
Monocytes Relative: 8.2 % (ref 3.0–12.0)
NEUTROS ABS: 3.8 10*3/uL (ref 1.4–7.7)
Neutrophils Relative %: 47.1 % (ref 43.0–77.0)
Platelets: 183 10*3/uL (ref 150.0–400.0)
RBC: 4.66 Mil/uL (ref 3.87–5.11)
RDW: 13.4 % (ref 11.5–14.6)
WBC: 8.2 10*3/uL (ref 4.5–10.5)

## 2014-01-10 LAB — COMPREHENSIVE METABOLIC PANEL
ALK PHOS: 54 U/L (ref 39–117)
ALT: 18 U/L (ref 0–35)
AST: 20 U/L (ref 0–37)
Albumin: 5.1 g/dL (ref 3.5–5.2)
BUN: 12 mg/dL (ref 6–23)
CO2: 30 mEq/L (ref 19–32)
CREATININE: 0.9 mg/dL (ref 0.4–1.2)
Calcium: 10 mg/dL (ref 8.4–10.5)
Chloride: 101 mEq/L (ref 96–112)
GFR: 78.29 mL/min (ref >60.00)
GLUCOSE: 92 mg/dL (ref 70–99)
Potassium: 3.9 mEq/L (ref 3.5–5.1)
Sodium: 140 mEq/L (ref 135–145)
Total Bilirubin: 0.4 mg/dL (ref 0.3–1.2)
Total Protein: 8 g/dL (ref 6.0–8.3)

## 2014-01-10 LAB — SEDIMENTATION RATE: SED RATE: 5 mm/h (ref 0–22)

## 2014-01-10 MED ORDER — DICYCLOMINE HCL 10 MG PO CAPS: 10.0000 mg | ORAL_CAPSULE | Freq: Two times a day (BID) | ORAL | Status: DC

## 2014-01-10 NOTE — Progress Notes (Signed)
[name] 1976-05-17 [id number]  Note: This dictation was prepared with Dragon digital system. Any transcriptional errors that result from this procedure are unintentional.   History of Present Illness:  This is a 38 year old white female with right upper quadrant abdominal pain which started in October 2014 and has been progressive. It started in the epigastrium and extended to the right upper quadrant and laterally to the back. It is partially positional.The pain is worse when she bends over or if she lays on the right side or picks up something from the floor.. It is also worse postprandially but it is better when she stretches out. There has been no fever or jaundice. Her upper abdominal ultrasound shows hepatomegaly with 17 cm liver span  and normal-appearing gallbladder as well as common bile duct. She has a small 2 cm hepatic cyst. A HIDA scan demonstrated  a normal ejection fraction of 87%. She has a history of abnormal liver function tests while pregnant with her first child in 2001. Her AST was 70 and ALT was 60. Her hepatitis serologies were negative. She was at that time in her 20th week of intrauterine pregnancy. The liver function abnormalities resolved spontaneously. There is no family history of liver disease and no personal history of alcohol intake. She has lost about 10 pounds in the last 6 months. She has frequent stools, 4-5 times a day but denies rectal bleeding. A CT scan of the abdomen in 2012 for left lower quadrant abdominal pain was negative.     Past Medical History  Diagnosis Date  . Depression   . Anxiety   . Headache(784.0)     stress related - otc meds prn  . GERD (gastroesophageal reflux disease)     no meds - diet controlled  . Liver cyst   . Ovarian cyst   . Endometriosis   . Fibrocystic breast disease     Past Surgical History  Procedure Laterality Date  . Tonsillectomy and adenoidectomy    . Diagnostic laparoscopy      x 3 surg for ovarian  cysts/endometriosis  . Cesarean section  2002, 2011    x 2  . Scar revision  04/08/2012    Procedure: SCAR REVISION;  Surgeon: Darlyn Chamber, MD;  Location: [facility];  Service: Gynecology;  Laterality: N/A;  c section incision revision    Allergies  Allergen Reactions  . Red Dye Anaphylaxis    #40  . Nexium [Esomeprazole Magnesium] Other (See Comments)    Severe migraines  . Neosporin [Neomycin-Bacitracin Zn-Polymyx] Swelling    As child, eyes swollen shut per patient    Family history and social history have been reviewed.  Review of Systems: Denies nausea vomiting dysphagia  The remainder of the 10 point ROS is negative except as outlined in the H&P  Physical Exam: General Appearance Well developed, in no distress Eyes  Non icteric  HEENT  Non traumatic, normocephalic  Mouth No lesion, tongue papillated, no cheilosis Neck Supple without adenopathy, thyroid not enlarged, no carotid bruits, no JVD Lungs Clear to auscultation bilaterally COR Normal S1, normal S2, regular rhythm, no murmur, quiet precordium Abdomen soft ,relaxed, tender along the right costal margin, worse on inspiration. Overall liver span is about 15 cm. Liver edge is at the costal margin. There is no ascites. No stigmata of chronic liver disease, splenic tip not palpable Rectal normal rectal sphincter tone. Soft Hemoccult-positive stool. Extremities  No pedal edema Skin No lesions Neurological Alert and oriented x 3  no asterixis Psychological Normal mood and affect  Assessment and Plan:   Problem #1 Right upper quadrant abdominal pain which based on my physical exam and her history is consistent with hepatic pain due to hepatomegaly. The etiology of hepatomegaly is not clear. We will check her liver function tests which were normal last month. We will also obtain a CT scan of the liver and spleen as well as the pelvis. We will check a sprue profile, ferritin, CBC, lipid profile, ANA titer, AMA titer,  ceruloplasmin, IgG and sedimentation rate. We will start tramadol 50 mg when necessary for abdominal pain and Bentyl 10 mg by mouth twice a day for diarrhea. Other disposition depending on the results of the above tests. Heme positive stool on our exam today. Could be anal source but have to consider IBD, "colitis", which would explain frequent stools. IBS is a strong consideration.Colonoscopy will be considered pending CT scan results. In the meantime start Bentyl.   Delfin Edis 01/10/2014

## 2014-01-10 NOTE — Patient Instructions (Addendum)
Your physician has requested that you go to the basement for the following lab work before leaving today: Ferritin, Celiac 10 Panel, CBC, CMET, Sed Rate, Ceruloplasmin, ANA, AMA  Please come back for your lipid panel to be drawn after you have been fasting for 6 hours.  We have sent the following medications to your pharmacy for you to pick up at your convenience: Tramadol 50 mg-Take 1 tablet by mouth every 6 hours as needed for pain Bentyl 10 mg-Take 1 tablet by mouth twice daily   You have been scheduled for a CT scan of the abdomen and pelvis at [facility] ([address]---this is in the same building as [facility]).   You are scheduled on ___________ at ______________. You should arrive 15 minutes prior to your appointment time for registration. Please follow the written instructions below on the day of your exam:  WARNING: IF YOU ARE ALLERGIC TO IODINE/X-RAY DYE, PLEASE NOTIFY RADIOLOGY IMMEDIATELY AT [phone]! YOU WILL BE GIVEN A 13 HOUR PREMEDICATION PREP.  1) Do not eat or drink anything after __________ (4 hours prior to your test) 2) You have been given 2 bottles of oral contrast to drink. The solution may taste better if refrigerated, but do NOT add ice or any other liquid to this solution. Shake well before drinking.    Drink 1 bottle of contrast @ __________ (2 hours prior to your exam)  Drink 1 bottle of contrast @ __________ (1 hour prior to your exam)  You may take any medications as prescribed with a small amount of water except for the following: Metformin, Glucophage, Glucovance, Avandamet, Riomet, Fortamet, Actoplus Met, Janumet, Glumetza or Metaglip. The above medications must be held the day of the exam AND 48 hours after the exam.  The purpose of you drinking the oral contrast is to aid in the visualization of your intestinal tract. The contrast solution may cause some diarrhea. Before your exam is started, you will be given a small amount of  fluid to drink. Depending on your individual set of symptoms, you may also receive an intravenous injection of x-ray contrast/dye. Plan on being at [facility] for 30 minutes or long, depending on the type of exam you are having performed.  This test typically takes 30-45 minutes to complete.  If you have any questions regarding your exam or if you need to reschedule, you may call the CT department at [phone] between the hours of 8:00 am and 5:00 pm, Monday-Friday.  ________________________________________________________________________  Cc:Dr Nnodi

## 2014-01-11 ENCOUNTER — Ambulatory Visit: Payer: 59 | Admitting: Internal Medicine

## 2014-01-12 ENCOUNTER — Encounter: Payer: Self-pay | Admitting: *Deleted

## 2014-01-12 LAB — ANTI-NUCLEAR AB-TITER (ANA TITER): ANA TITER 1: NEGATIVE (ref <1:40)

## 2014-01-12 LAB — ANA: ANA: POSITIVE — AB

## 2014-01-12 LAB — CELIAC PANEL 10
Endomysial Screen: NEGATIVE
GLIADIN IGG: 7.7 U/mL (ref <20)
Gliadin IgA: 15.7 U/mL (ref <20)
IGA: 107 mg/dL (ref 69–380)
Tissue Transglut Ab: 6.9 U/mL (ref <20)
Tissue Transglutaminase Ab, IgA: 3 U/mL (ref <20)

## 2014-01-12 LAB — MITOCHONDRIAL ANTIBODIES: Mitochondrial M2 Ab, IgG: 0.53 (ref <0.91)

## 2014-01-12 LAB — CERULOPLASMIN: CERULOPLASMIN: 27 mg/dL (ref 20–60)

## 2014-01-13 ENCOUNTER — Other Ambulatory Visit (INDEPENDENT_AMBULATORY_CARE_PROVIDER_SITE_OTHER): Payer: 59

## 2014-01-13 ENCOUNTER — Ambulatory Visit (INDEPENDENT_AMBULATORY_CARE_PROVIDER_SITE_OTHER)
Admission: RE | Admit: 2014-01-13 | Discharge: 2014-01-13 | Disposition: A | Discharge status: Home / Self Care | Payer: 59 | Source: Ambulatory Visit | Attending: Internal Medicine | Admitting: Internal Medicine

## 2014-01-13 DIAGNOSIS — R109 Unspecified abdominal pain: Secondary | ICD-10-CM

## 2014-01-13 DIAGNOSIS — R195 Other fecal abnormalities: Secondary | ICD-10-CM

## 2014-01-13 DIAGNOSIS — R160 Hepatomegaly, not elsewhere classified: Secondary | ICD-10-CM

## 2014-01-13 DIAGNOSIS — R1011 Right upper quadrant pain: Secondary | ICD-10-CM

## 2014-01-13 LAB — LIPID PANEL
CHOL/HDL RATIO: 3
Cholesterol: 178 mg/dL (ref 0–200)
HDL: 57.3 mg/dL (ref >39.00)
LDL Cholesterol: 107 mg/dL — ABNORMAL HIGH (ref 0–99)
Triglycerides: 71 mg/dL (ref 0.0–149.0)
VLDL: 14.2 mg/dL (ref 0.0–40.0)

## 2014-01-13 MED ORDER — IOHEXOL 300 MG/ML  SOLN
100.0000 mL | Freq: Once | INTRAMUSCULAR | Status: AC | PRN
  Administered 2014-01-13: 100 mL via INTRAVENOUS

## 2014-01-14 ENCOUNTER — Telehealth: Payer: Self-pay | Admitting: *Deleted

## 2014-01-14 LAB — IGG: IGG (IMMUNOGLOBIN G), SERUM: 849 mg/dL (ref 690–1700)

## 2014-01-14 MED ORDER — METHYLPREDNISOLONE (PAK) 4 MG PO TABS: ORAL_TABLET | ORAL | Status: DC

## 2014-01-14 NOTE — Telephone Encounter (Signed)
I have spoken to patient at length regarding CT. I have advised that there were no significant abnormalities on the scan. I also explained that Dr Olevia Perches thinks her liver probably was slightly enlarged at time of ultrasound but not enough to be alarming for the radiologist reading the CT. She thinks that it is possible that the enlargement has irritated the area around the liver and therefore would like to try medrol pack for 1 week to see if it helps the pain at all. So far, the Bentyl bid and Tramadol have not really made a difference. I also explained that patient could have more than 1 GI problem going on. She did have heme positive stool and has been having a change in bowel habits. Patient has agreed to take the medrol dose pack and call back in 1 week to let us know how she is doing. If no better, we will proceed with colonoscopy.

## 2014-01-14 NOTE — Telephone Encounter (Signed)
Message copied by Larina Bras on Fri Jan 14, 2014  5:28 PM ------      Message from: Lafayette Dragon      Created: Fri Jan 14, 2014  1:16 PM       Please call pt with normal CT scan of the liver- no abnormality to explain RUQ abd.pain. I put her on Bentyl bid, is it helping. Please put her on a trial of Medrol pack  To see if pain better if it is rib cage related. Call us back in 1 week, if not better then colonoscopy. ------

## 2014-01-19 ENCOUNTER — Telehealth: Payer: Self-pay | Admitting: *Deleted

## 2014-01-19 DIAGNOSIS — R197 Diarrhea, unspecified: Secondary | ICD-10-CM

## 2014-01-19 DIAGNOSIS — R109 Unspecified abdominal pain: Secondary | ICD-10-CM

## 2014-01-19 DIAGNOSIS — R195 Other fecal abnormalities: Secondary | ICD-10-CM

## 2014-01-19 MED ORDER — PREPOPIK 10-3.5-12 MG-GM-GM PO PACK: 1.0000 | PACK | ORAL | Status: DC

## 2014-01-19 NOTE — Telephone Encounter (Signed)
Patient reports continued RUQ abdominal pain and states that pain is now in LUQ as well. Wondered if Medrol pak could cause her to have this discomfort. I advised that it typically does not cause these symptoms. Advised patient that she needs colonoscopy since Medrol pak is not helping and since she has heme positive stool. She verbalizes understanding and is scheduled for colonoscopy on 01/27/14. Dr Olevia Perches, any further thoughts (do I need to add EGD or anything) or is colonoscopy only okay?

## 2014-01-19 NOTE — Telephone Encounter (Signed)
I have reviewed my note, and agree with scheduling EGD/colon.

## 2014-01-19 NOTE — Telephone Encounter (Signed)
I have spoken to patient to advise that Dr [name] agrees to go ahead with EGD and colon for evaluation of her continued symptoms. We will make further disposition pending those tests. Patient verbalizes understanding.

## 2014-01-20 ENCOUNTER — Encounter: Payer: Self-pay | Admitting: Internal Medicine

## 2014-01-27 ENCOUNTER — Ambulatory Visit (AMBULATORY_SURGERY_CENTER): Payer: 59 | Admitting: Internal Medicine

## 2014-01-27 ENCOUNTER — Encounter: Payer: Self-pay | Admitting: Internal Medicine

## 2014-01-27 VITALS — BP 130/81 | HR 68 | Temp 97.8°F | Resp 16 | Ht 63.0 in | Wt 134.0 lb

## 2014-01-27 DIAGNOSIS — D1330 Benign neoplasm of unspecified part of small intestine: Secondary | ICD-10-CM

## 2014-01-27 DIAGNOSIS — R195 Other fecal abnormalities: Secondary | ICD-10-CM

## 2014-01-27 DIAGNOSIS — R198 Other specified symptoms and signs involving the digestive system and abdomen: Secondary | ICD-10-CM

## 2014-01-27 DIAGNOSIS — D131 Benign neoplasm of stomach: Secondary | ICD-10-CM

## 2014-01-27 DIAGNOSIS — D126 Benign neoplasm of colon, unspecified: Secondary | ICD-10-CM

## 2014-01-27 MED ORDER — SODIUM CHLORIDE 0.9 % IV SOLN: 500.0000 mL | INTRAVENOUS | Status: DC

## 2014-01-27 MED ORDER — PB-HYOSCY-ATROPINE-SCOPOLAMINE 16.2 MG PO TABS: 1.0000 | ORAL_TABLET | Freq: Two times a day (BID) | ORAL | Status: DC

## 2014-01-27 NOTE — Progress Notes (Signed)
Called to room to assist during endoscopic procedure.  Patient ID and intended procedure confirmed with present staff. Received instructions for my participation in the procedure from the performing physician.  

## 2014-01-27 NOTE — Progress Notes (Signed)
Lidocaine-40mg IV prior to Propofol InductionPropofol given over incremental dosages 

## 2014-01-27 NOTE — Op Note (Signed)
[place]  [address] [name], [id number]   ENDOSCOPY PROCEDURE REPORT  PATIENT: Pamela, [name]  MR#: [id number] BIRTHDATE: 1976-09-24 , 37  yrs. old GENDER: Female ENDOSCOPIST: Lafayette Dragon, MD REFERRED BY:  Gavin Pound, M.D. PROCEDURE DATE:  01/27/2014 PROCEDURE:  EGD w/ biopsy ASA CLASS:     Class I INDICATIONS:  Dyspepsia.   Epigastric pain.   abdominal pain in the upper right quadrant.   no response to PPIs.  HIDA scan and ultrasound negative.  Mild hepatomegaly on ultrasound.  Heme positive stool on physical exam. MEDICATIONS: MAC sedation, administered by CRNA and propofol (Diprivan) 250mg  IV TOPICAL ANESTHETIC: Cetacaine Spray  DESCRIPTION OF PROCEDURE: After the risks benefits and alternatives of the procedure were thoroughly explained, informed consent was obtained.  The LB [id number] endoscope was introduced through the mouth and advanced to the second portion of the duodenum. Without limitations.  The instrument was slowly withdrawn as the mucosa was fully examined.      Esophagus: Proximal mid and distal esophageal mucosa were normal. There was no stricture. There was no esophagitis. Endoscope traversed into the stomach without resistance. There was no significant hiatal hernia Stomach: Gastric mucosa appeared normal. Rugal fortes were unremarkable. Gastric antrum and pyloric outlet were normal. Retroflexion of the endoscope and the normal fundus and cardia biopsies were taken from the gastric antrum to rule out H. pyloric Duodenum: Duodenal bulb and descending duodenum was normal abscesses were obtained from the second portion duodenum to rule out villous atrophy[         The scope was then withdrawn from the patient and the procedure completed.  COMPLICATIONS: There were no complications. ENDOSCOPIC IMPRESSION: essentially normal upper endoscopy of esophagus stomach and duodenum. Status post biopsy from gastric antrum and  from the second portion duodenum. Nothing to account for abdominal pain  RECOMMENDATIONS: 1.  Await pathology results 2.  trial of Donnatal 1 po bid for functional abd.  pain  REPEAT EXAM:  eSigned:  Lafayette Dragon, MD 01/27/2014 3:35 PM   CC:  PATIENT NAME:  Pamela, [name] MR#: [id number]

## 2014-01-27 NOTE — Patient Instructions (Signed)
Discharge instructions given with verbal understanding. Biopsies taken. Handout on polyps. Samples of medications given. Resume previous medications. YOU HAD AN ENDOSCOPIC PROCEDURE TODAY AT [facility]: Refer to the procedure report that was given to you for any specific questions about what was found during the examination.  If the procedure report does not answer your questions, please call your gastroenterologist to clarify.  If you requested that your care partner not be given the details of your procedure findings, then the procedure report has been included in a sealed envelope for you to review at your convenience later.  YOU SHOULD EXPECT: Some feelings of bloating in the abdomen. Passage of more gas than usual.  Walking can help get rid of the air that was put into your GI tract during the procedure and reduce the bloating. If you had a lower endoscopy (such as a colonoscopy or flexible sigmoidoscopy) you may notice spotting of blood in your stool or on the toilet paper. If you underwent a bowel prep for your procedure, then you may not have a normal bowel movement for a few days.  DIET: Your first meal following the procedure should be a light meal and then it is ok to progress to your normal diet.  A half-sandwich or bowl of soup is an example of a good first meal.  Heavy or fried foods are harder to digest and may make you feel nauseous or bloated.  Likewise meals heavy in dairy and vegetables can cause extra gas to form and this can also increase the bloating.  Drink plenty of fluids but you should avoid alcoholic beverages for 24 hours.  ACTIVITY: Your care partner should take you home directly after the procedure.  You should plan to take it easy, moving slowly for the rest of the day.  You can resume normal activity the day after the procedure however you should NOT DRIVE or use heavy machinery for 24 hours (because of the sedation medicines used during the test).     SYMPTOMS TO REPORT IMMEDIATELY: A gastroenterologist can be reached at any hour.  During normal business hours, 8:30 AM to 5:00 PM Monday through Friday, call [phone].  After hours and on weekends, please call the GI answering service at [phone] who will take a message and have the physician on call contact you.   Following lower endoscopy (colonoscopy or flexible sigmoidoscopy):  Excessive amounts of blood in the stool  Significant tenderness or worsening of abdominal pains  Swelling of the abdomen that is new, acute  Fever of 100F or higher  FOLLOW UP: If any biopsies were taken you will be contacted by phone or by letter within the next 1-3 weeks.  Call your gastroenterologist if you have not heard about the biopsies in 3 weeks.  Our staff will call the home number listed on your records the next business day following your procedure to check on you and address any questions or concerns that you may have at that time regarding the information given to you following your procedure. This is a courtesy call and so if there is no answer at the home number and we have not heard from you through the emergency physician on call, we will assume that you have returned to your regular daily activities without incident.  SIGNATURES/CONFIDENTIALITY: You and/or your care partner have signed paperwork which will be entered into your electronic medical record.  These signatures attest to the fact that that the information above on  your After Visit Summary has been reviewed and is understood.  Full responsibility of the confidentiality of this discharge information lies with you and/or your care-partner. 

## 2014-01-27 NOTE — Op Note (Signed)
[place]  [address] [place], [id number]   COLONOSCOPY PROCEDURE REPORT  PATIENT: [name], Pamela  MR#: [id number] BIRTHDATE: [birth year] , 37  yrs. old GENDER: Female ENDOSCOPIST: Lafayette Dragon, MD REFERRED UV:OZDGU NNODI, M.D. PROCEDURE DATE:  01/27/2014 PROCEDURE:   Colonoscopy, diagnostic and Colonoscopy with cold biopsy polypectomy First Screening Colonoscopy - Avg.  risk and is 50 yrs.  old or older - No.  Prior Negative Screening - Now for repeat screening. N/A  History of Adenoma - Now for follow-up colonoscopy & has been > or = to 3 yrs.  N/A  Polyps Removed Today? No.  Recommend repeat exam, <10 yrs? No. ASA CLASS:   Class I INDICATIONS:abdominal pain in the upper left quadrant and abdominal pain in the upper right quadrant. MEDICATIONS: MAC sedation, administered by CRNA and Propofol (Diprivan)  DESCRIPTION OF PROCEDURE:   After the risks benefits and alternatives of the procedure were thoroughly explained, informed consent was obtained.  A digital rectal exam revealed no abnormalities of the rectum.   The LB CF-[id number] [id number] endoscope was introduced through the anus and advanced to the cecum, which was identified by both the appendix and ileocecal valve. No adverse events experienced.   The quality of the prep was good, using MoviPrep  The instrument was then slowly withdrawn as the colon was fully examined.      COLON FINDINGS: Four diminutive firm sessile polyps were found in the sigmoid colon.  A polypectomy was performed with cold forceps. The resection was complete and the polyp tissue was completely retrieved.random biopsies of the colon were obtained to rule out microscopic colitis colon was tortuous particularly in the hepatic flexure where it also curved under the liver and it was difficult to negotiate around hepatic flexure Retroflexed views revealed no abnormalities. The time to cecum=13 minutes 0 seconds.   Withdrawal time=8 minutes 49 seconds.  The scope was withdrawn and the procedure completed. COMPLICATIONS: There were no complications.  ENDOSCOPIC IMPRESSION: Four diminutive sessile polyps were found in the sigmoid colon; polypectomy was performed with cold forceps status post random colon biopsies to rule out microscopic colitis, and nothing to account for heme positive stool RECOMMENDATIONS: 1.  Await pathology results 2.   add Donnatal 1 po bid,, suspect functional abd.  pain 3 .tortuous and redundant colon,possible cause for abdominal pain  eSigned:  Lafayette Dragon, MD 01/27/2014 3:43 PM   cc:   PATIENT NAME:  Pamela, [name] MR#: [id number]

## 2014-01-28 ENCOUNTER — Telehealth: Payer: Self-pay

## 2014-01-28 NOTE — Telephone Encounter (Signed)
Left message on answering machine. 

## 2014-02-01 ENCOUNTER — Encounter: Payer: Self-pay | Admitting: Internal Medicine

## 2014-02-03 ENCOUNTER — Encounter: Payer: Self-pay | Admitting: *Deleted

## 2014-03-07 ENCOUNTER — Ambulatory Visit (INDEPENDENT_AMBULATORY_CARE_PROVIDER_SITE_OTHER): Payer: 59 | Admitting: Emergency Medicine

## 2014-03-07 ENCOUNTER — Ambulatory Visit: Payer: 59

## 2014-03-07 VITALS — BP 118/74 | HR 70 | Temp 98.1°F | Resp 16 | Ht 63.0 in | Wt 140.2 lb

## 2014-03-07 DIAGNOSIS — R932 Abnormal findings on diagnostic imaging of liver and biliary tract: Secondary | ICD-10-CM

## 2014-03-07 DIAGNOSIS — R1011 Right upper quadrant pain: Secondary | ICD-10-CM

## 2014-03-07 LAB — POCT URINALYSIS DIPSTICK
Bilirubin, UA: NEGATIVE
Glucose, UA: NEGATIVE
Ketones, UA: NEGATIVE
LEUKOCYTES UA: NEGATIVE
NITRITE UA: NEGATIVE
PROTEIN UA: NEGATIVE
Spec Grav, UA: 1.02
Urobilinogen, UA: 0.2
pH, UA: 6

## 2014-03-07 LAB — POCT UA - MICROSCOPIC ONLY
Bacteria, U Microscopic: NEGATIVE
Casts, Ur, LPF, POC: NEGATIVE
Crystals, Ur, HPF, POC: NEGATIVE
Mucus, UA: POSITIVE
WBC, UR, HPF, POC: 0
YEAST UA: NEGATIVE

## 2014-03-07 LAB — POCT URINE PREGNANCY: Preg Test, Ur: NEGATIVE

## 2014-03-07 MED ORDER — GABAPENTIN 600 MG PO TABS: ORAL_TABLET | ORAL | Status: DC

## 2014-03-07 MED ORDER — PREGABALIN 50 MG PO CAPS
ORAL_CAPSULE | ORAL | Status: DC
Start: 2014-03-07 — End: 2014-03-07

## 2014-03-07 NOTE — Addendum Note (Signed)
Addended by: Arlyss Queen A on: 03/07/2014 12:22 PM   Modules accepted: Orders, Medications

## 2014-03-07 NOTE — Progress Notes (Signed)
Subjective:    Patient ID: [name], female    DOB: 1976-02-22, 38 y.o.   MRN: [id number]  Abdominal Pain   38 year old female presents for RUQ pain. She has had a colonoscopy and endoscopy plus bloodwork done by Dr. Olevia Perches. They do not think it is endometriosis. She stopped taking her BC pill October of last year. Once she stopped taking her BC pill she noticed the symptoms started off and on. She has had the constant RUQ pain since January. It is painful to sit up straight. She feels pressure on her rib cage when she sits up. She had a CT scan of her liver with a small benign cyst and enlargement of the liver on 01/14/14. Her liver functions tests are normal. Dr. Olevia Perches has tried IBS treatments to see if that helps. Her colonoscopy showed four benign polyps. She has had a positive and negative ANA by Dr. Maurene Capes. She had a sed rate of 5. Dr. Radene Knee put her on a .1 hormone patch last week, but she is not using it anymore.  She also has some abd pain starting on the left side of her sternum. She describes some of her pains as "labor pains." Her pain is also a continued pressure with shooting pain on the RUQ that shoots around to her back.  She was on a diet when the symptoms started so she has had some recent weight loss. Her appetite has gone down.    Review of Systems  Gastrointestinal: Positive for abdominal pain.  Genitourinary: Positive for flank pain.       Results for orders placed in visit on 03/07/14  POCT UA - MICROSCOPIC ONLY      Result Value Ref Range   WBC, Ur, HPF, POC 0     RBC, urine, microscopic 0-3     Bacteria, U Microscopic neg     Mucus, UA pos     Epithelial cells, urine per micros 0-2     Crystals, Ur, HPF, POC neg     Casts, Ur, LPF, POC neg     Yeast, UA neg    POCT URINALYSIS DIPSTICK      Result Value Ref Range   Color, UA yellow     Clarity, UA clear     Glucose, UA neg     Bilirubin, UA neg     Ketones, UA neg     Spec Grav, UA 1.020     Blood, UA  trace     pH, UA 6.0     Protein, UA neg     Urobilinogen, UA 0.2     Nitrite, UA neg     Leukocytes, UA Negative    POCT URINE PREGNANCY      Result Value Ref Range   Preg Test, Ur Negative       Objective:   Physical Exam Pt is tender along the right side of her abdomen. The fatty tissue under her skin has a rippled look.  Patient is tearful but in no acute distress. Her neck is supple. Her chest was clear her heart is irregular irregular without murmur. Her abdomen is flat liver and spleen are not enlarged there is tenderness over the entire right chest wall as stated above there appears to be some loss of subcutaneous tissue bilaterally  [facility] reading (PRIMARY) by  Dr.Catcher Dehoyos.Rib films normal; t-spine normal      Assessment & Plan:  She does have a prominent liver on scan with  a cystic area. We can evaluate this further with an MRI. Also did regular films of the T-spine and ribs.. films are normal. We'll give trial of Lyrica. MRI of the liver has been ordered. Referral made to [facility] dermatology to rule out focal scleroderma. Will refer to [facility] orthopedics if testing is unrevealing. She has had a thorough workup by Dr. Olevia Perches.

## 2014-03-07 NOTE — Patient Instructions (Signed)
We are going to schedule you for an MRI of the liver. I placed her on Lyrica because of the red dye sensitivity to Neurontin. A referral will be made to dermatology.

## 2014-03-08 ENCOUNTER — Other Ambulatory Visit: Payer: Self-pay | Admitting: *Deleted

## 2014-03-08 DIAGNOSIS — K7689 Other specified diseases of liver: Secondary | ICD-10-CM

## 2014-03-08 NOTE — Addendum Note (Signed)
Addended by: Jethro Bolus A on: 03/08/2014 10:40 AM   Modules accepted: Orders

## 2014-03-08 NOTE — Progress Notes (Signed)
Referrals needed order changed per insurance and [facility] to with and without contrast.

## 2014-03-08 NOTE — Progress Notes (Signed)
Okay to change the order

## 2014-03-12 ENCOUNTER — Ambulatory Visit
Admission: RE | Admit: 2014-03-12 | Discharge: 2014-03-12 | Disposition: A | Discharge status: Home / Self Care | Payer: 59 | Source: Ambulatory Visit | Attending: Emergency Medicine | Admitting: Emergency Medicine

## 2014-03-12 DIAGNOSIS — K7689 Other specified diseases of liver: Secondary | ICD-10-CM

## 2014-03-12 MED ORDER — GADOBENATE DIMEGLUMINE 529 MG/ML IV SOLN
14.0000 mL | Freq: Once | INTRAVENOUS | Status: AC | PRN
  Administered 2014-03-12: 14 mL via INTRAVENOUS

## 2014-03-15 ENCOUNTER — Telehealth: Payer: Self-pay | Admitting: Radiology

## 2014-06-21 ENCOUNTER — Other Ambulatory Visit: Payer: Self-pay | Admitting: Family Medicine

## 2014-06-21 DIAGNOSIS — R109 Unspecified abdominal pain: Secondary | ICD-10-CM

## 2014-06-24 ENCOUNTER — Ambulatory Visit
Admission: RE | Admit: 2014-06-24 | Discharge: 2014-06-24 | Disposition: A | Discharge status: Home / Self Care | Payer: 59 | Source: Ambulatory Visit | Attending: Family Medicine | Admitting: Family Medicine

## 2014-06-24 DIAGNOSIS — R109 Unspecified abdominal pain: Secondary | ICD-10-CM

## 2014-06-26 ENCOUNTER — Other Ambulatory Visit: Payer: 59

## 2014-08-29 ENCOUNTER — Other Ambulatory Visit: Payer: Self-pay | Admitting: Obstetrics and Gynecology

## 2014-08-30 LAB — CYTOLOGY - PAP

## 2014-09-07 ENCOUNTER — Encounter (HOSPITAL_COMMUNITY): Payer: Self-pay | Admitting: *Deleted

## 2014-09-15 NOTE — H&P (Signed)
NAMESHELLE, [name] NO.:  [id number]  MEDICAL RECORD NO.:  [id number]  LOCATION:  [facility]                         FACILITY:  [facility]  PHYSICIAN:  Darlyn Chamber, M.D.   DATE OF BIRTH:  [birth year]  DATE OF ADMISSION:  09/21/2014 DATE OF DISCHARGE:                             HISTORY & PHYSICAL   DATE OF SURGERY:  September 21, 2014, to be done at [facility].  HISTORY OF PRESENT ILLNESS:  The patient is a 38 year old, gravida 2, para 2 married female who presents for diagnostic laparoscopy.  The patient has been experiencing increasing pelvic pain, discomfort, particularly with intercourse.  She does have a past history of endometriosis diagnosed laparoscopically.  It is of note, she is menopausal and is presently on hormone replacement therapy.  We had done pelvic ultrasounds that were relatively unremarkable and she wishes to proceed with diagnostic laparoscopy to rule out such things as recurrent endometriosis.  ALLERGIES:  She is allergic to red dye and Neosporin.  MEDICATIONS:  She is on estradiol 1 mg daily.  Progesterone 100 mg daily, gabapentin 200 mg twice a day.  PAST MEDICAL HISTORY:  Usual childhood diseases.  No significant sequelae.  PAST SURGICAL HISTORY:  She has had a tonsillectomy.  She has had a cyst removed from her hip.  She had a right ovarian cystectomy.  In 2000, she had a perineoplasty and diagnostic laparoscopy.  She has also had 2 cesarean sections.  Anesthetic scar revision from her last cesarean section.  FAMILY HISTORY:  There is a history of breast, uterine and colon cancer. Also, a history of diabetes or heart disease.  SOCIAL HISTORY:  Reveals no tobacco, alcohol use.  REVIEW OF SYSTEMS:  Noncontributory.  PHYSICAL EXAMINATION:  VITAL SIGNS:  The patient is afebrile.  Stable vital signs. HEENT:  The patient is normocephalic.  Pupils equal, round, reactive to light and accommodation.  Extraocular movements  were intact.  Sclerae clear.  Oropharynx clear. NECK:  Without thyromegaly.  Breasts not examined. LUNGS:  Clear. HEART SYSTEM:  Regular rhythm rate.  There are no murmurs or gallops. ABDOMEN:  Benign.  Well-healed low-transverse incision. PELVIC:  Normal external genitalia.  Vaginal mucosa is clear.  Cervix unremarkable.  Uterus normal size and shape.  Some moderate tenderness. Adnexa unremarkable.  IMPRESSION: 1. Pelvic pain and discomfort, rule out recurrent endometriosis. 2. Would be menopausal, on exogenous hormone replacement therapy.  PLAN:  The patient is requesting to proceed with diagnostic laparoscopy laser standby.  The risks of surgery have been discussed including the risk of infection, the risk of vascular injury.  This could lead to hemorrhage requiring transfusion with the risk of AIDS or hepatitis. There is a risk of injury to adjacent organs including bladder, bowel, ureters that could require further exploratory surgery.  Risk of deep venous thrombosis and pulmonary embolus.  The patient expressed understanding of the indications and risks.     Darlyn Chamber, M.D.     JSM/MEDQ  D:  09/15/2014  T:  09/15/2014  Job:  [id number]

## 2014-09-15 NOTE — H&P (Signed)
  Patient name  [name], [name] DICTATION#   [id number] CSN# [id number]  Darlyn Chamber, MD 09/15/2014 11:27 AM

## 2014-09-19 ENCOUNTER — Encounter (HOSPITAL_COMMUNITY): Payer: Self-pay | Admitting: Pharmacist

## 2014-09-20 ENCOUNTER — Encounter (HOSPITAL_COMMUNITY): Payer: Self-pay | Admitting: *Deleted

## 2014-09-21 ENCOUNTER — Encounter (HOSPITAL_COMMUNITY)
Admission: RE | Disposition: A | Discharge status: Home / Self Care | Payer: Self-pay | Source: Ambulatory Visit | Attending: Obstetrics and Gynecology

## 2014-09-21 ENCOUNTER — Encounter (HOSPITAL_COMMUNITY): Payer: Self-pay | Admitting: Certified Registered"

## 2014-09-21 ENCOUNTER — Encounter (HOSPITAL_COMMUNITY): Payer: 59 | Admitting: Anesthesiology

## 2014-09-21 ENCOUNTER — Ambulatory Visit (HOSPITAL_COMMUNITY)
Admission: RE | Admit: 2014-09-21 | Discharge: 2014-09-21 | Disposition: A | Discharge status: Home / Self Care | Payer: 59 | Source: Ambulatory Visit | Attending: Obstetrics and Gynecology | Admitting: Obstetrics and Gynecology

## 2014-09-21 ENCOUNTER — Ambulatory Visit (HOSPITAL_COMMUNITY): Payer: 59 | Admitting: Anesthesiology

## 2014-09-21 DIAGNOSIS — IMO0002 Reserved for concepts with insufficient information to code with codable children: Secondary | ICD-10-CM

## 2014-09-21 DIAGNOSIS — N803 Endometriosis of pelvic peritoneum: Secondary | ICD-10-CM | POA: Diagnosis not present

## 2014-09-21 DIAGNOSIS — N8329 Other ovarian cysts: Secondary | ICD-10-CM | POA: Insufficient documentation

## 2014-09-21 DIAGNOSIS — Z888 Allergy status to other drugs, medicaments and biological substances status: Secondary | ICD-10-CM | POA: Diagnosis not present

## 2014-09-21 DIAGNOSIS — R102 Pelvic and perineal pain: Secondary | ICD-10-CM | POA: Diagnosis present

## 2014-09-21 DIAGNOSIS — Z780 Asymptomatic menopausal state: Secondary | ICD-10-CM | POA: Diagnosis not present

## 2014-09-21 DIAGNOSIS — Z91041 Radiographic dye allergy status: Secondary | ICD-10-CM | POA: Diagnosis not present

## 2014-09-21 DIAGNOSIS — Z87891 Personal history of nicotine dependence: Secondary | ICD-10-CM | POA: Diagnosis not present

## 2014-09-21 HISTORY — DX: Nausea with vomiting, unspecified: R11.2

## 2014-09-21 HISTORY — DX: Other specified postprocedural states: Z98.890

## 2014-09-21 HISTORY — PX: LAPAROSCOPY: SHX197

## 2014-09-21 LAB — CBC
HCT: 42.1 % (ref 36.0–46.0)
HEMOGLOBIN: 13.9 g/dL (ref 12.0–15.0)
MCH: 32 pg (ref 26.0–34.0)
MCHC: 33 g/dL (ref 30.0–36.0)
MCV: 96.8 fL (ref 78.0–100.0)
Platelets: 193 10*3/uL (ref 150–400)
RBC: 4.35 MIL/uL (ref 3.87–5.11)
RDW: 13.5 % (ref 11.5–15.5)
WBC: 9.2 10*3/uL (ref 4.0–10.5)

## 2014-09-21 SURGERY — LAPAROSCOPY, DIAGNOSTIC
Anesthesia: General | Site: Abdomen

## 2014-09-21 MED ORDER — ROCURONIUM BROMIDE 100 MG/10ML IV SOLN
INTRAVENOUS | Status: AC
  Filled 2014-09-21: qty 1

## 2014-09-21 MED ORDER — LACTATED RINGERS IV SOLN
INTRAVENOUS | Status: DC
  Administered 2014-09-21 (×3): via INTRAVENOUS

## 2014-09-21 MED ORDER — CEFAZOLIN SODIUM-DEXTROSE 2-3 GM-% IV SOLR
INTRAVENOUS | Status: AC
  Filled 2014-09-21: qty 50

## 2014-09-21 MED ORDER — BUPIVACAINE HCL (PF) 0.25 % IJ SOLN
INTRAMUSCULAR | Status: DC | PRN
  Administered 2014-09-21: 7 mL

## 2014-09-21 MED ORDER — ROCURONIUM BROMIDE 100 MG/10ML IV SOLN
INTRAVENOUS | Status: DC | PRN
  Administered 2014-09-21: 30 mg via INTRAVENOUS

## 2014-09-21 MED ORDER — GLYCOPYRROLATE 0.2 MG/ML IJ SOLN
INTRAMUSCULAR | Status: AC
  Filled 2014-09-21: qty 3

## 2014-09-21 MED ORDER — DEXAMETHASONE SODIUM PHOSPHATE 10 MG/ML IJ SOLN
INTRAMUSCULAR | Status: AC
  Filled 2014-09-21: qty 1

## 2014-09-21 MED ORDER — NEOSTIGMINE METHYLSULFATE 10 MG/10ML IV SOLN
INTRAVENOUS | Status: DC | PRN
  Administered 2014-09-21: 4 mg via INTRAVENOUS

## 2014-09-21 MED ORDER — FENTANYL CITRATE 0.05 MG/ML IJ SOLN
INTRAMUSCULAR | Status: AC
  Administered 2014-09-21: 25 ug via INTRAVENOUS
  Filled 2014-09-21: qty 2

## 2014-09-21 MED ORDER — ONDANSETRON HCL 4 MG/2ML IJ SOLN
INTRAMUSCULAR | Status: AC
  Filled 2014-09-21: qty 2

## 2014-09-21 MED ORDER — PROPOFOL 10 MG/ML IV BOLUS
INTRAVENOUS | Status: DC | PRN
  Administered 2014-09-21: 170 mg via INTRAVENOUS

## 2014-09-21 MED ORDER — LIDOCAINE HCL (CARDIAC) 20 MG/ML IV SOLN
INTRAVENOUS | Status: DC | PRN
  Administered 2014-09-21: 80 mg via INTRAVENOUS

## 2014-09-21 MED ORDER — FENTANYL CITRATE 0.05 MG/ML IJ SOLN
INTRAMUSCULAR | Status: AC
  Filled 2014-09-21: qty 5

## 2014-09-21 MED ORDER — DEXAMETHASONE SODIUM PHOSPHATE 10 MG/ML IJ SOLN
INTRAMUSCULAR | Status: DC | PRN
  Administered 2014-09-21: 10 mg via INTRAVENOUS

## 2014-09-21 MED ORDER — SCOPOLAMINE 1 MG/3DAYS TD PT72
1.0000 | MEDICATED_PATCH | Freq: Once | TRANSDERMAL | Status: DC
  Administered 2014-09-21: 1.5 mg via TRANSDERMAL

## 2014-09-21 MED ORDER — KETOROLAC TROMETHAMINE 30 MG/ML IJ SOLN
INTRAMUSCULAR | Status: DC | PRN
  Administered 2014-09-21: 30 mg via INTRAVENOUS

## 2014-09-21 MED ORDER — MIDAZOLAM HCL 2 MG/2ML IJ SOLN
INTRAMUSCULAR | Status: AC
  Filled 2014-09-21: qty 2

## 2014-09-21 MED ORDER — FENTANYL CITRATE 0.05 MG/ML IJ SOLN
INTRAMUSCULAR | Status: DC | PRN
  Administered 2014-09-21 (×3): 50 ug via INTRAVENOUS

## 2014-09-21 MED ORDER — FENTANYL CITRATE 0.05 MG/ML IJ SOLN
25.0000 ug | INTRAMUSCULAR | Status: DC | PRN
  Administered 2014-09-21: 25 ug via INTRAVENOUS

## 2014-09-21 MED ORDER — PROPOFOL 10 MG/ML IV EMUL
INTRAVENOUS | Status: AC
  Filled 2014-09-21: qty 20

## 2014-09-21 MED ORDER — GLYCOPYRROLATE 0.2 MG/ML IJ SOLN
INTRAMUSCULAR | Status: DC | PRN
  Administered 2014-09-21: .7 mg via INTRAVENOUS

## 2014-09-21 MED ORDER — LIDOCAINE HCL (CARDIAC) 20 MG/ML IV SOLN
INTRAVENOUS | Status: AC
  Filled 2014-09-21: qty 5

## 2014-09-21 MED ORDER — LACTATED RINGERS IR SOLN
Status: DC | PRN
  Administered 2014-09-21: 3000 mL

## 2014-09-21 MED ORDER — NEOSTIGMINE METHYLSULFATE 10 MG/10ML IV SOLN
INTRAVENOUS | Status: AC
  Filled 2014-09-21: qty 1

## 2014-09-21 MED ORDER — SCOPOLAMINE 1 MG/3DAYS TD PT72
MEDICATED_PATCH | TRANSDERMAL | Status: AC
  Administered 2014-09-21: 1.5 mg via TRANSDERMAL
  Filled 2014-09-21: qty 1

## 2014-09-21 MED ORDER — HEPARIN SODIUM (PORCINE) 5000 UNIT/ML IJ SOLN
INTRAMUSCULAR | Status: AC
  Filled 2014-09-21: qty 1

## 2014-09-21 MED ORDER — KETOROLAC TROMETHAMINE 30 MG/ML IJ SOLN
INTRAMUSCULAR | Status: AC
  Filled 2014-09-21: qty 1

## 2014-09-21 MED ORDER — CEFAZOLIN SODIUM-DEXTROSE 2-3 GM-% IV SOLR
2.0000 g | INTRAVENOUS | Status: AC
Start: 2014-09-21 — End: 2014-09-21
  Administered 2014-09-21: 2 g via INTRAVENOUS

## 2014-09-21 MED ORDER — BUPIVACAINE HCL (PF) 0.25 % IJ SOLN
INTRAMUSCULAR | Status: AC
  Filled 2014-09-21: qty 30

## 2014-09-21 MED ORDER — MIDAZOLAM HCL 2 MG/2ML IJ SOLN
INTRAMUSCULAR | Status: DC | PRN
  Administered 2014-09-21: 2 mg via INTRAVENOUS

## 2014-09-21 MED ORDER — OXYCODONE-ACETAMINOPHEN 7.5-325 MG PO TABS: 1.0000 | ORAL_TABLET | ORAL | Status: DC | PRN

## 2014-09-21 MED ORDER — ONDANSETRON HCL 4 MG/2ML IJ SOLN
INTRAMUSCULAR | Status: DC | PRN
  Administered 2014-09-21: 4 mg via INTRAVENOUS

## 2014-09-21 SURGICAL SUPPLY — 27 items
BLADE SURG 11 STRL SS (BLADE) ×2 IMPLANT
CABLE HIGH FREQUENCY MONO STRZ (ELECTRODE) IMPLANT
CATH ROBINSON RED A/P 16FR (CATHETERS) ×1 IMPLANT
CLOTH BEACON ORANGE TIMEOUT ST (SAFETY) ×2 IMPLANT
DRSG COVADERM PLUS 2X2 (GAUZE/BANDAGES/DRESSINGS) ×3 IMPLANT
DRSG OPSITE POSTOP 3X4 (GAUZE/BANDAGES/DRESSINGS) ×1 IMPLANT
ELECT REM PT RETURN 9FT ADLT (ELECTROSURGICAL) ×2
ELECTRODE REM PT RTRN 9FT ADLT (ELECTROSURGICAL) ×1 IMPLANT
GLOVE BIO SURGEON STRL SZ7 (GLOVE) ×3 IMPLANT
GOWN STRL REUS W/TWL LRG LVL3 (GOWN DISPOSABLE) ×4 IMPLANT
LIQUID BAND (GAUZE/BANDAGES/DRESSINGS) ×2 IMPLANT
NEEDLE INSUFFLATION 120MM (ENDOMECHANICALS) IMPLANT
NS IRRIG 1000ML POUR BTL (IV SOLUTION) ×2 IMPLANT
PACK LAPAROSCOPY BASIN (CUSTOM PROCEDURE TRAY) ×2 IMPLANT
PAD TRENDELENBURG OR TABLE (MISCELLANEOUS) ×2 IMPLANT
PROTECTOR NERVE ULNAR (MISCELLANEOUS) ×2 IMPLANT
SET IRRIG TUBING LAPAROSCOPIC (IRRIGATION / IRRIGATOR) ×1 IMPLANT
SUT VIC AB 3-0 PS2 18 (SUTURE) ×2
SUT VIC AB 3-0 PS2 18XBRD (SUTURE) ×1 IMPLANT
SUT VICRYL 0 ENDOLOOP (SUTURE) IMPLANT
SUT VICRYL 0 UR6 27IN ABS (SUTURE) ×1 IMPLANT
TOWEL OR 17X24 6PK STRL BLUE (TOWEL DISPOSABLE) ×4 IMPLANT
TROCAR BALLN 12MMX100 BLUNT (TROCAR) IMPLANT
TROCAR OPTI TIP 5M 100M (ENDOMECHANICALS) ×2 IMPLANT
TROCAR XCEL DIL TIP R 11M (ENDOMECHANICALS) ×2 IMPLANT
WARMER LAPAROSCOPE (MISCELLANEOUS) ×2 IMPLANT
WATER STERILE IRR 1000ML POUR (IV SOLUTION) ×2 IMPLANT

## 2014-09-21 NOTE — Op Note (Signed)
[name], WOLANSKI                  ACCOUNT NO.:  [id number]  MEDICAL RECORD NO.:  [id number]  LOCATION:  [facility]                          FACILITY:  [facility]  PHYSICIAN:  Darlyn Chamber, M.D.   DATE OF BIRTH:  [birth year]  DATE OF PROCEDURE:  09/21/2014 DATE OF DISCHARGE:                              OPERATIVE REPORT   PREOPERATIVE DIAGNOSES:  Pelvic pain with history of endometriosis.  POSTOPERATIVE DIAGNOSIS:  Evidence of some recurrent pelvic endometriosis.  OPERATIVE PROCEDURE:  Open laparoscopy with cautery of endometriotic implants.  SURGEON:  Darlyn Chamber, M.D.  ANESTHESIA:  General endotracheal.  ESTIMATED BLOOD LOSS:  Minimal.  PACKS AND DRAINS:  None.  INTRAOPERATIVE BLOOD PLACED:  None.  COMPLICATIONS:  None.  INDICATION:  Dictated in history and physical.  PROCEDURE IN DETAIL:  As follows; the patient was taken to the OR and placed in supine position.  After satisfactory level of general endotracheal anesthesia was obtained, the patient was placed in dorsal lithotomy position using the Allen stirrups.  The abdomen, perineum, and vagina were prepped out with Betadine.  Bladder was emptied by in-and- out catheterization.  The Hulka clamp was put in place and secured.  The patient was draped in sterile field.  Subumbilical incision made with knife and extended through the subcutaneous tissue.  Fascia was identified and entered sharply, incision was fashioned laterally. Peritoneum was entered with blunt finger pressure.  Open laparoscopic trocar was put in place and secured.  The abdomen was insufflated with carbon dioxide.  Laparoscope was introduced.  Visualization revealed no evidence of injury to adjacent organs.  The liver appeared to be normal along with the gallbladder.  The appendix was also normal.  Uterus was elevated.  She had some simple looking cyst of each ovary, had a peritoneal defect in the cul-de-sac and some small implants of endometriosis.  Some  implants of reddish lesions were noted on the bladder flap.  We used the bipolar.  We cauterized the peritoneal defect completely obliterating that.  We also cauterized small areas of discoloration in the cul-de-sac as well as on the bladder area itself. All areas were adequately cauterized.  We thoroughly irrigated the pelvis, removed all irrigation.  There was no active bleeding or signs of injury to adjacent organs.  The abdomen was deflated with carbon dioxide.  All trocars were removed.  Subumbilical fascia was closed with interrupted figure-of-eight of 0 Vicryl.  Skin was closed with interrupted subcuticulars of 4-0 Vicryl.  Suprapubic incision was closed Dermabond. The Hulka tenaculum was removed.  The patient taken out of the dorsal lithotomy position.  Once alert and extubated, was transferred to recovery room in good condition.  Sponge, instrument, and needle count was correct by circulating nurse x2.     Darlyn Chamber, M.D.     JSM/MEDQ  D:  09/21/2014  T:  09/21/2014  Job:  [id number]

## 2014-09-21 NOTE — Discharge Instructions (Signed)
DISCHARGE INSTRUCTIONS: Laparoscopy  The following instructions have been prepared to help you care for yourself upon your return home today.  May remove Scop patch on or before 09/24/2014  May take Ibuprofen/ Motrin/ Advil/ Aleve after 2:07 P.M. as needed for pain  Wound care:  Do not get the incision wet for the first 24 hours. The incision should be kept clean and dry.  The bandage on belly button may be removed after 48 hours.  The other Band-Aids or dressings may be removed the day after surgery.  Should the incision become sore, red, and swollen after the first week, check with your doctor.  Personal hygiene:  Shower the day after your procedure.  Activity and limitations:  Do NOT drive or operate any equipment today.  Do NOT lift anything more than 15 pounds for 2-3 weeks after surgery.  Do NOT rest in bed all day.  Walking is encouraged. Walk each day, starting slowly with 5-minute walks 3 or 4 times a day. Slowly increase the length of your walks.  Walk up and down stairs slowly.  Do NOT do strenuous activities, such as golfing, playing tennis, bowling, running, biking, weight lifting, gardening, mowing, or vacuuming for 2-4 weeks. Ask your doctor when it is okay to start.  Diet: Eat a light meal as desired this evening. You may resume your usual diet tomorrow.  Return to work: This is dependent on the type of work you do. For the most part you can return to a desk job within a week of surgery. If you are more active at work, please discuss this with your doctor.  What to expect after your surgery: You may have a slight burning sensation when you urinate on the first day. You may have a very small amount of blood in the urine. Expect to have a small amount of vaginal discharge/light bleeding for 1-2 weeks. It is not unusual to have abdominal soreness and bruising for up to 2 weeks. You may be tired and need more rest for about 1 week. You may experience shoulder pain  for 24-72 hours. Lying flat in bed may relieve it.  Call your doctor for any of the following:  Develop a fever of 100.4 or greater  Inability to urinate 6 hours after discharge from hospital  Severe pain not relieved by pain medications  Persistent of heavy bleeding at incision site  Redness or swelling around incision site after a week  Increasing nausea or vomiting

## 2014-09-21 NOTE — Anesthesia Postprocedure Evaluation (Signed)
  Anesthesia Post-op Note  Patient: [name]  Procedure(s) Performed: Procedure(s): LAPAROSCOPY DIAGNOSTIC,WITH CAUTERY OF ENDOMETRIAL IMPLANTS (N/A)  Patient is awake and responsive. Pain and nausea are reasonably well controlled. Vital signs are stable and clinically acceptable. Oxygen saturation is clinically acceptable. There are no apparent anesthetic complications at this time. Patient is ready for discharge.

## 2014-09-21 NOTE — Transfer of Care (Signed)
Immediate Anesthesia Transfer of Care Note  Patient: [name]  Procedure(s) Performed: Procedure(s): LAPAROSCOPY DIAGNOSTIC,WITH CAUTERY OF ENDOMETRIAL IMPLANTS (N/A)  Patient Location: PACU  Anesthesia Type:General  Level of Consciousness: awake, alert  and oriented  Airway & Oxygen Therapy: Patient Spontanous Breathing and Patient connected to nasal cannula oxygen  Post-op Assessment: Report given to PACU RN, Post -op Vital signs reviewed and stable and Patient moving all extremities  Post vital signs: Reviewed and stable  Complications: No apparent anesthesia complications

## 2014-09-21 NOTE — Brief Op Note (Signed)
09/21/2014  8:23 AM  PATIENT:  [name]  38 y.o. female  PRE-OPERATIVE DIAGNOSIS:  pelvic pain  POST-OPERATIVE DIAGNOSIS:  pelvic pain,ENDOMETRIOSIS  PROCEDURE:  Procedure(s): LAPAROSCOPY DIAGNOSTIC,WITH CAUTERY OF ENDOMETRIAL IMPLANTS (N/A)  SURGEON:  Surgeon(s) and Role:    * Darlyn Chamber, MD - Primary  PHYSICIAN ASSISTANT:   ASSISTANTS: none   ANESTHESIA:   regional and general  EBL:  Total I/O In: 1000 [I.V.:1000] Out: -   BLOOD ADMINISTERED:none  DRAINS: none   LOCAL MEDICATIONS USED:  MARCAINE     SPECIMEN:  No Specimen  DISPOSITION OF SPECIMEN:  N/A  COUNTS:  YES  TOURNIQUET:  * No tourniquets in log *  DICTATION: .Other Dictation: Dictation Number [id number]  PLAN OF CARE: Discharge to home after PACU  PATIENT DISPOSITION:  PACU - hemodynamically stable.   Delay start of Pharmacological VTE agent (>24hrs) due to surgical blood loss or risk of bleeding: not applicable

## 2014-09-21 NOTE — Anesthesia Preprocedure Evaluation (Signed)
Anesthesia Evaluation  Patient identified by MRN, date of birth, ID band Patient awake    Reviewed: Allergy & Precautions, H&P , Patient's Chart, lab work & pertinent test results, reviewed documented beta blocker date and time   Airway Mallampati: II  TM Distance: >3 FB Neck ROM: full    Dental no notable dental hx.    Pulmonary former smoker,  breath sounds clear to auscultation  Pulmonary exam normal       Cardiovascular Rhythm:regular Rate:Normal     Neuro/Psych    GI/Hepatic Enlarged liver from Cyst; takes gabapentin for pain relief   Endo/Other    Renal/GU      Musculoskeletal   Abdominal   Peds  Hematology   Anesthesia Other Findings   Reproductive/Obstetrics                             Anesthesia Physical Anesthesia Plan  ASA: II  Anesthesia Plan: General   Post-op Pain Management:    Induction: Intravenous  Airway Management Planned: Oral ETT  Additional Equipment:   Intra-op Plan:   Post-operative Plan: Extubation in OR  Informed Consent: I have reviewed the patients History and Physical, chart, labs and discussed the procedure including the risks, benefits and alternatives for the proposed anesthesia with the patient or authorized representative who has indicated his/her understanding and acceptance.   Dental Advisory Given and Dental advisory given  Plan Discussed with: CRNA and Surgeon  Anesthesia Plan Comments: (  Discussed general anesthesia, including possible nausea, instrumentation of airway, sore throat,pulmonary aspiration, etc. I asked if the were any outstanding questions, or  concerns before we proceeded. )        Anesthesia Quick Evaluation

## 2014-09-21 NOTE — H&P (Signed)
  History and physical exam unchanged 

## 2014-09-22 ENCOUNTER — Encounter (HOSPITAL_COMMUNITY): Payer: Self-pay | Admitting: Obstetrics and Gynecology

## 2015-01-12 ENCOUNTER — Encounter: Payer: Self-pay | Admitting: *Deleted

## 2015-01-12 ENCOUNTER — Ambulatory Visit: Payer: Self-pay | Admitting: Nurse Practitioner

## 2015-01-13 ENCOUNTER — Other Ambulatory Visit (INDEPENDENT_AMBULATORY_CARE_PROVIDER_SITE_OTHER): Payer: 59

## 2015-01-13 ENCOUNTER — Encounter: Payer: Self-pay | Admitting: Nurse Practitioner

## 2015-01-13 ENCOUNTER — Ambulatory Visit (INDEPENDENT_AMBULATORY_CARE_PROVIDER_SITE_OTHER): Payer: 59 | Admitting: Nurse Practitioner

## 2015-01-13 VITALS — BP 102/70 | HR 80 | Ht 63.0 in | Wt 138.0 lb

## 2015-01-13 DIAGNOSIS — R1011 Right upper quadrant pain: Secondary | ICD-10-CM

## 2015-01-13 LAB — HEPATIC FUNCTION PANEL
ALT: 12 U/L (ref 0–35)
AST: 18 U/L (ref 0–37)
Albumin: 4.5 g/dL (ref 3.5–5.2)
Alkaline Phosphatase: 60 U/L (ref 39–117)
BILIRUBIN TOTAL: 0.5 mg/dL (ref 0.2–1.2)
Bilirubin, Direct: 0.1 mg/dL (ref 0.0–0.3)
Total Protein: 7.4 g/dL (ref 6.0–8.3)

## 2015-01-13 MED ORDER — TRAMADOL HCL 50 MG PO TABS: 50.0000 mg | ORAL_TABLET | Freq: Four times a day (QID) | ORAL | Status: DC | PRN

## 2015-01-13 NOTE — Progress Notes (Signed)
     History of Present Illness:  Patient is 39 year old female known to Dr. Olevia Perches. She was evaluated in December of last year for heme positive stools and right upper quadrant pain of undetermined etiology. Hepatomegaly found but LFTs were unremarkable and genetic / autoimmune markers of liver disease were all negative. Imaging revealed a right liver lobe cyst. Follow up MRI showed cyst to be 1.9cm. EGD and colonoscopy were unrermarkable.   Pain spontaneously resolved. Since her last visit here patient did undergo surgery for endometriosis. Per patient, liver seen at time of surgery and appeared "a little big" but without any other abnormalities.    Patient is here for evaluation recurrent RUQ pain. The pain is a constant pressure with intermittent sharp pain occurring without relationship to eating. Recent MRI or thoracic spine was normal.   Current Medications, Allergies, Past Medical History, Past Surgical History, Family History and Social History were reviewed in [employer] record.   Physical Exam: General: Pleasant, well developed , white female in no acute distress Head: Normocephalic and atraumatic Eyes:  sclerae anicteric, conjunctiva pink  Ears: Normal auditory acuity Lungs: Clear throughout to auscultation Heart: Regular rate and rhythm Abdomen: Soft, non distended, moderate RUQ tenderness with even light palpation.  No masses, no hepatomegaly. Normal bowel sounds Musculoskeletal: Positive Carnett's sign. Right lower anterior ribcage tenderness.  Extremities: No edema  Skin: no lesions, especially in RUQ / right side Neurological: Alert oriented x 4, grossly nonfocal Psychological:  Alert and cooperative. Normal mood and affect  Assessment and Recommendations:  39 year old female with recurrent RUQ pain. Pain is a constant pressure with intermittent sharpness unrelated to meals but definitely worse when engaging abdominal muscles. She had an extensive  workup for this same pain last year. Other than mild hepatomegaly, workup was negative. Exam seems most compatible with musculoskeletal pain but patient is quite worried and would like to repeat an ultrasound. LFTs and repeat ultrasound not unreasonable. In addition will refer to Dr. Nelva Bush (Orthopedics) for consideration of a nerve block.

## 2015-01-13 NOTE — Patient Instructions (Addendum)
Please go to the basement level to have your labs drawn.  We faxed a prescription for Ultram 50 mg for pain.  You have been scheduled for an abdominal ultrasound at [facility] (1st floor of hospital) on Tues 01-17-2015 at 9:30 am. Please arrive at 9:15 minutes prior to your appointment for registration. Make certain not to have anything to eat or drink 6 hours prior to your appointment. Should you need to reschedule your appointment, please contact radiology at [phone]. This test typically takes about 30 minutes to perform.  We are making a referral to [facility] . [phone] Appointment Date: 01-18-2015 , arrive at 9:00 am  For a 9:30 am appointment.

## 2015-01-15 ENCOUNTER — Encounter: Payer: Self-pay | Admitting: Nurse Practitioner

## 2015-01-15 DIAGNOSIS — R1011 Right upper quadrant pain: Secondary | ICD-10-CM | POA: Insufficient documentation

## 2015-01-16 NOTE — Progress Notes (Signed)
Reviewed with P.Guuenther,NP ,and seen the pt. I agree to repeat sono and  Treat as MS pain. ( she already tried Medrol pack last year).

## 2015-01-17 ENCOUNTER — Ambulatory Visit (HOSPITAL_COMMUNITY)
Admission: RE | Admit: 2015-01-17 | Discharge: 2015-01-17 | Disposition: A | Discharge status: Home / Self Care | Payer: 59 | Source: Ambulatory Visit | Attending: Nurse Practitioner | Admitting: Nurse Practitioner

## 2015-01-17 DIAGNOSIS — K7689 Other specified diseases of liver: Secondary | ICD-10-CM | POA: Diagnosis not present

## 2015-01-17 DIAGNOSIS — R1011 Right upper quadrant pain: Secondary | ICD-10-CM

## 2015-01-28 DIAGNOSIS — K317 Polyp of stomach and duodenum: Secondary | ICD-10-CM

## 2015-01-28 HISTORY — DX: Polyp of stomach and duodenum: K31.7

## 2015-06-15 ENCOUNTER — Other Ambulatory Visit: Payer: Self-pay

## 2015-06-15 DIAGNOSIS — Z1231 Encounter for screening mammogram for malignant neoplasm of breast: Secondary | ICD-10-CM

## 2015-06-28 IMAGING — CR DG RIBS W/ CHEST 3+V*R*
3 series · 3 of 3 positions shown · non-contrast
Comparison: None.

CLINICAL DATA: Right upper quadrant pain since [REDACTED]. Pressure on
rib cage when she sits up.

EXAM:
RIGHT RIBS AND CHEST - 3+ VIEW

[PA]
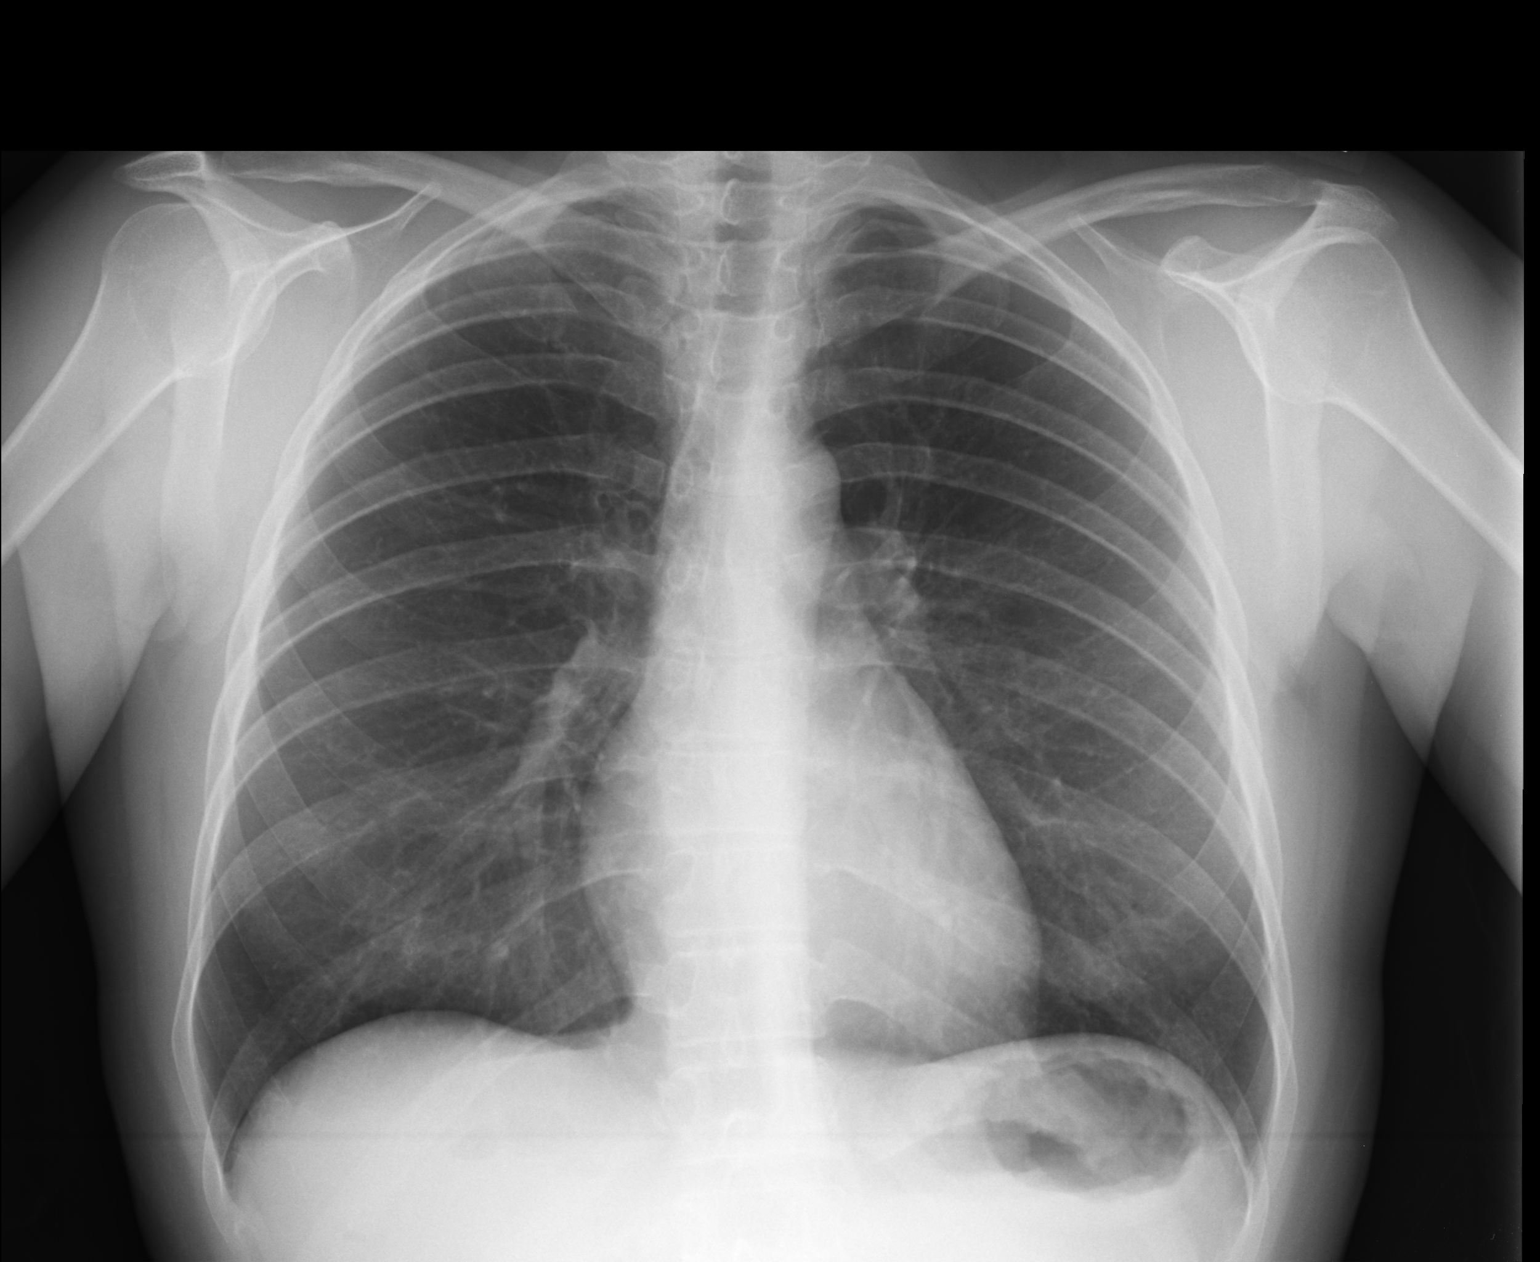

[rao]
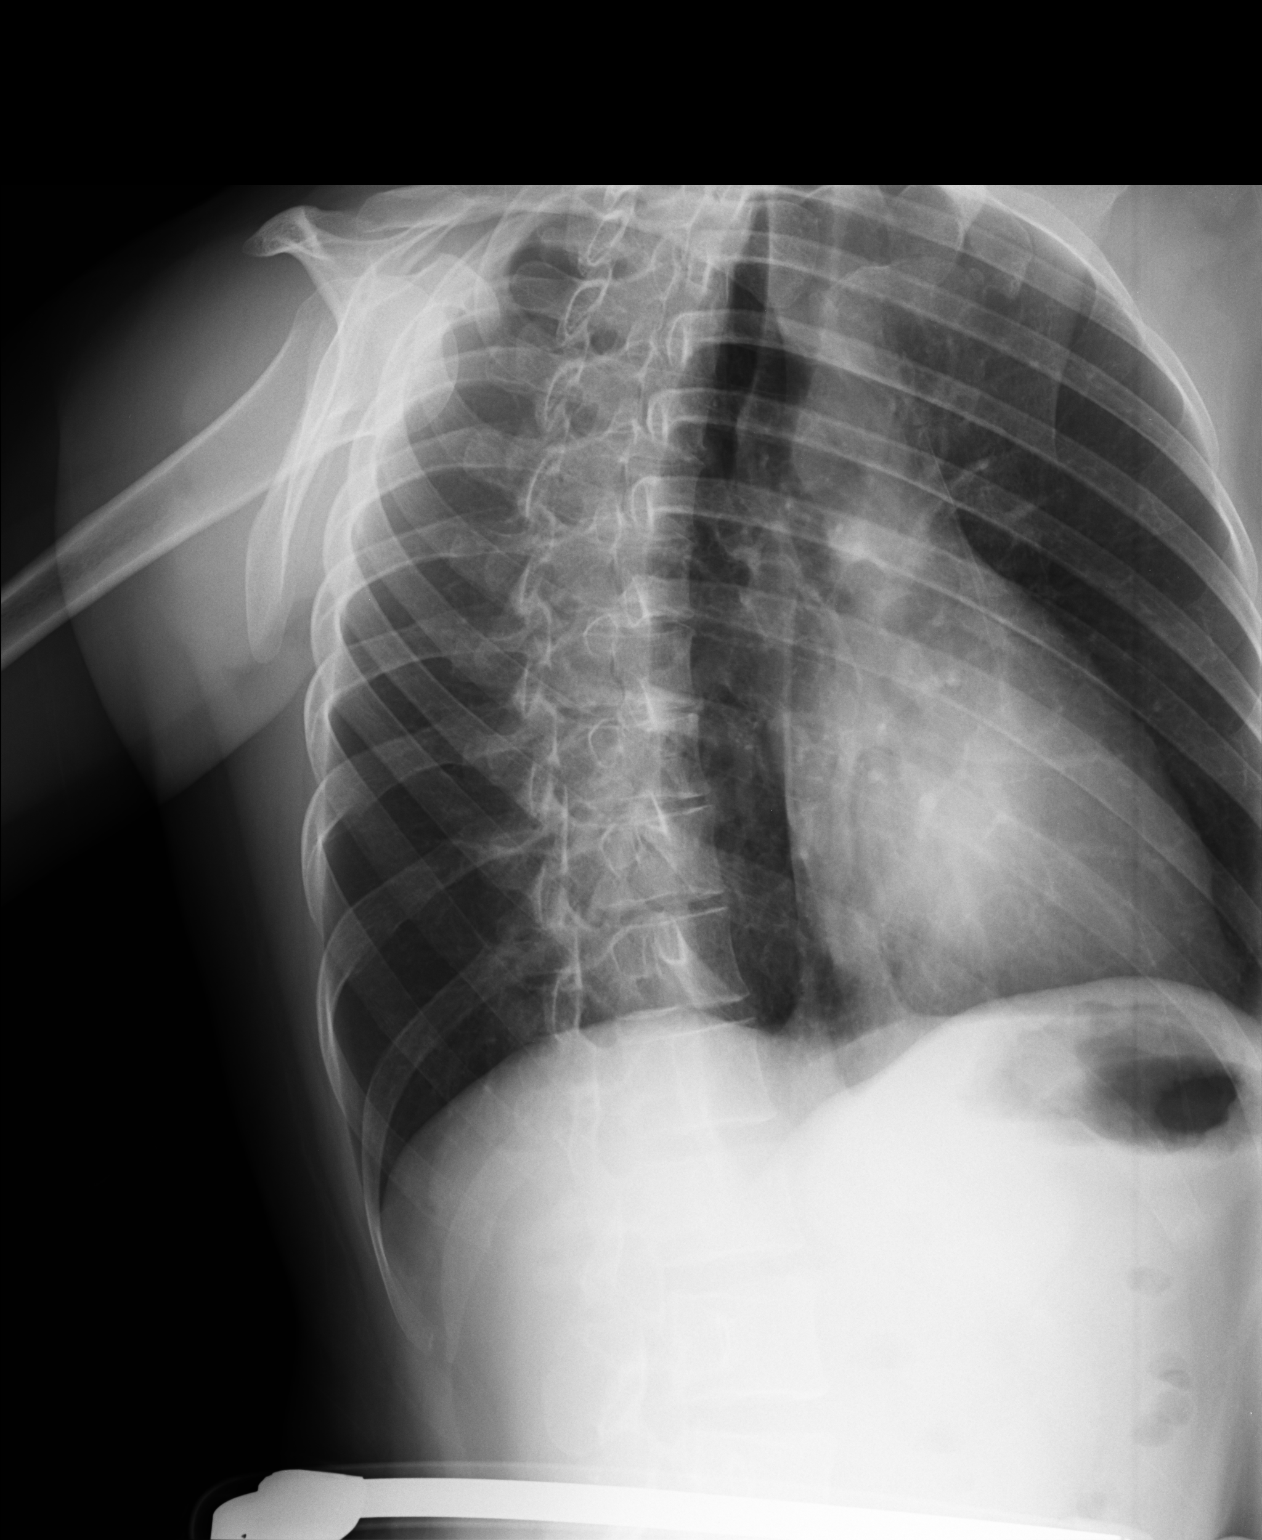

[lao]
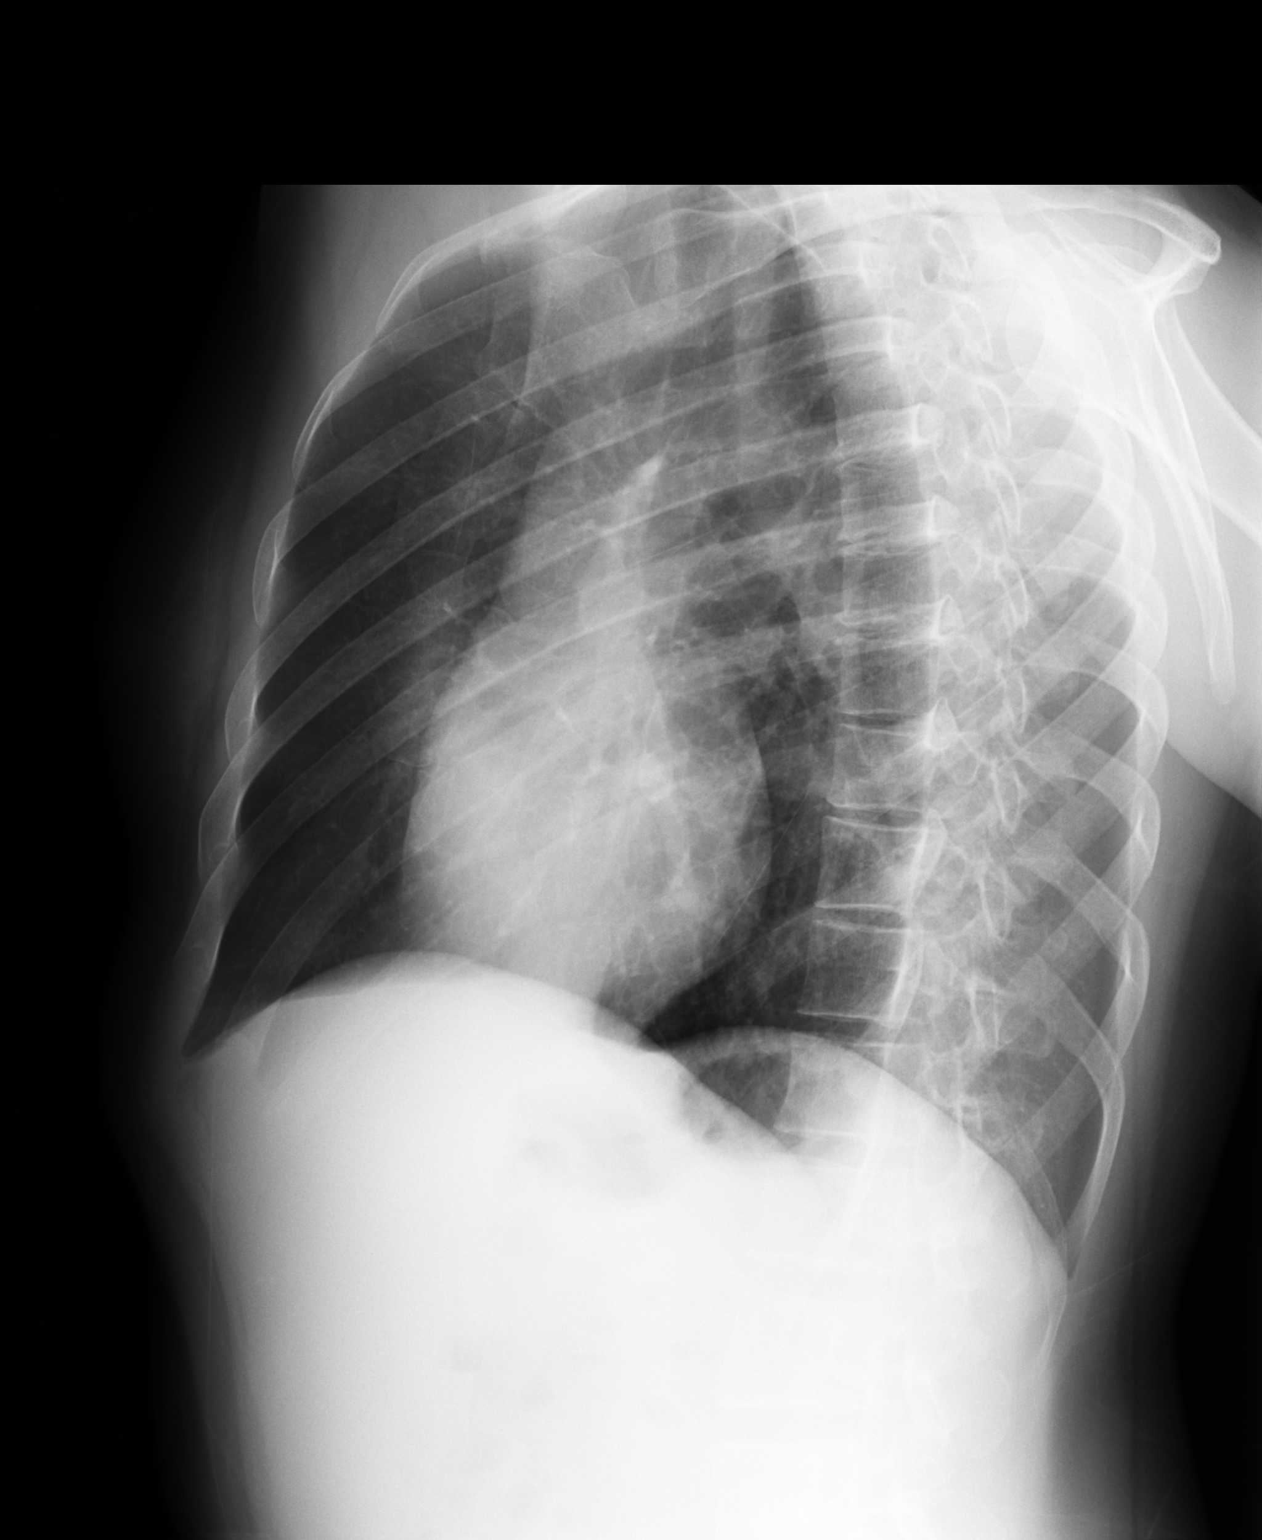

[3 of 3 positions shown; findings below may reference images not displayed]

FINDINGS: No fracture or other bone lesions are seen involving the ribs. There
is no evidence of pneumothorax or pleural effusion. Both lungs are
clear. Heart size and mediastinal contours are within normal limits.
IMPRESSION: Negative.

## 2015-06-28 IMAGING — CR DG THORACIC SPINE 2V
2 series · 2 of 2 positions shown · non-contrast
Comparison: None.

CLINICAL DATA: Pain

EXAM:
THORACIC SPINE - 2 VIEW

[AP]
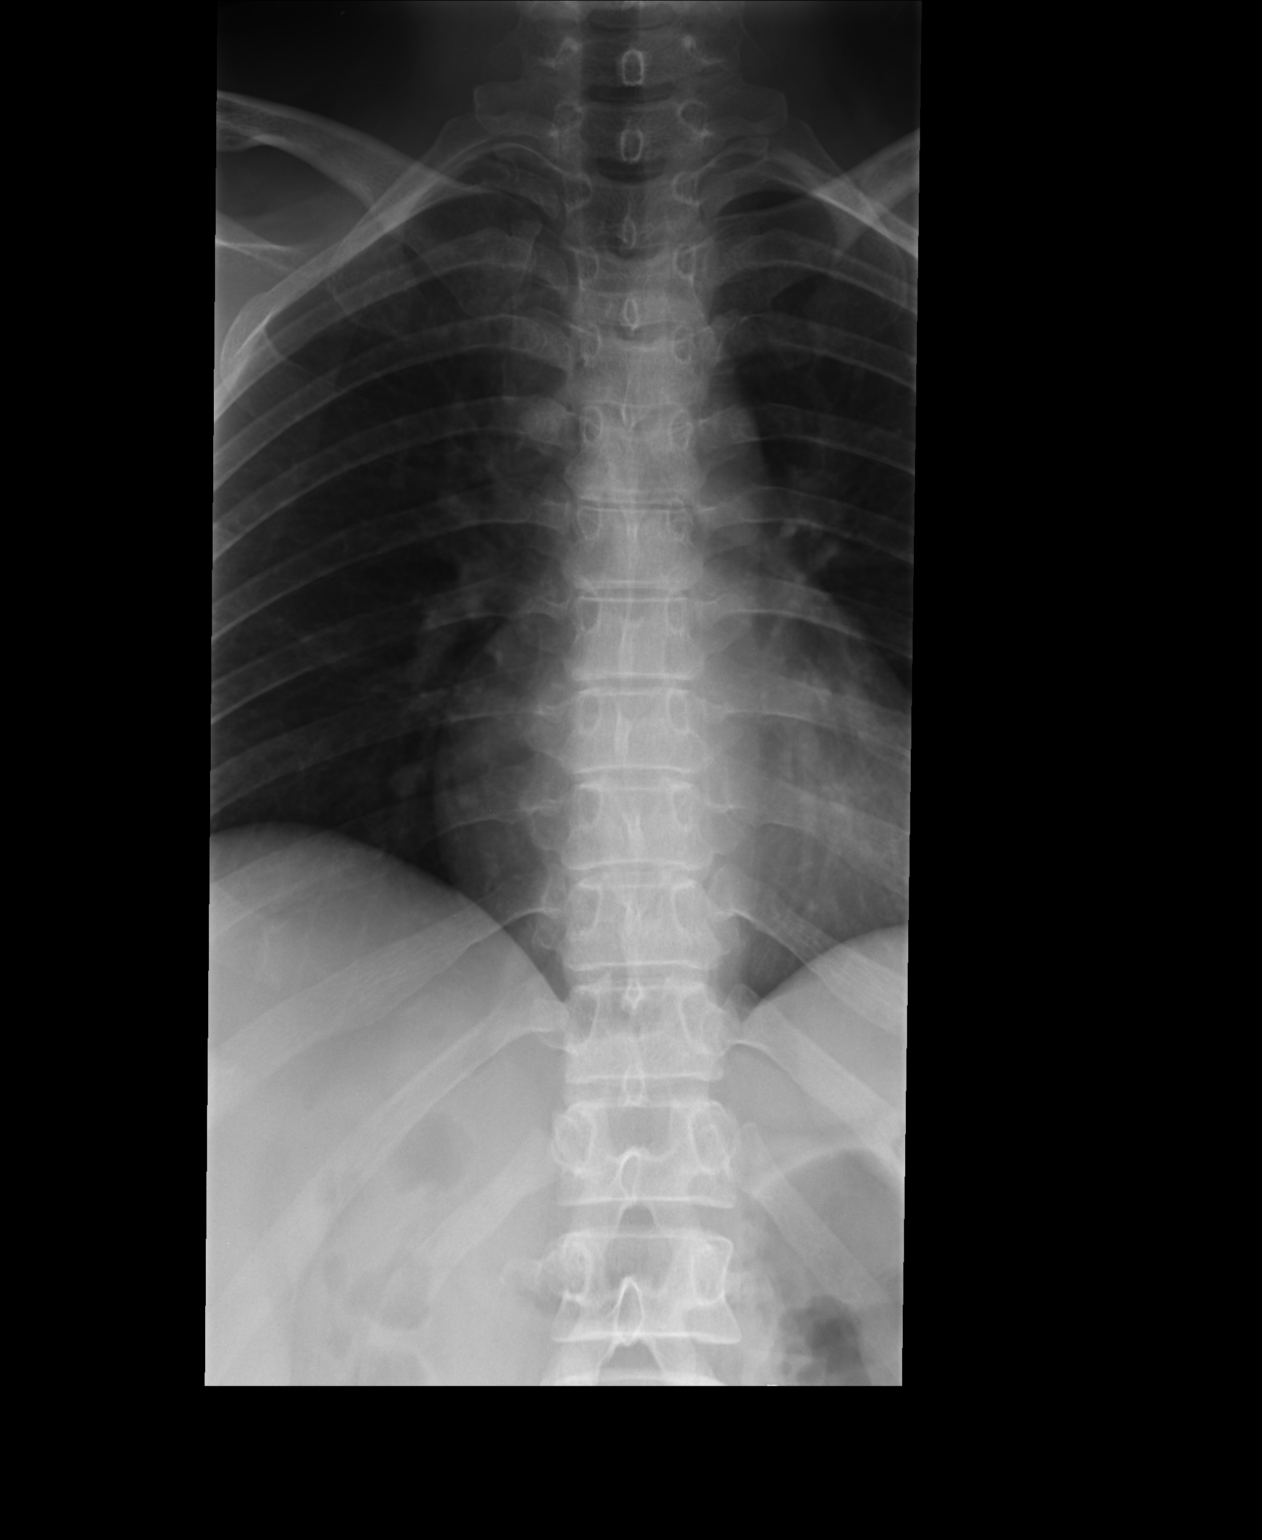

[lateral]
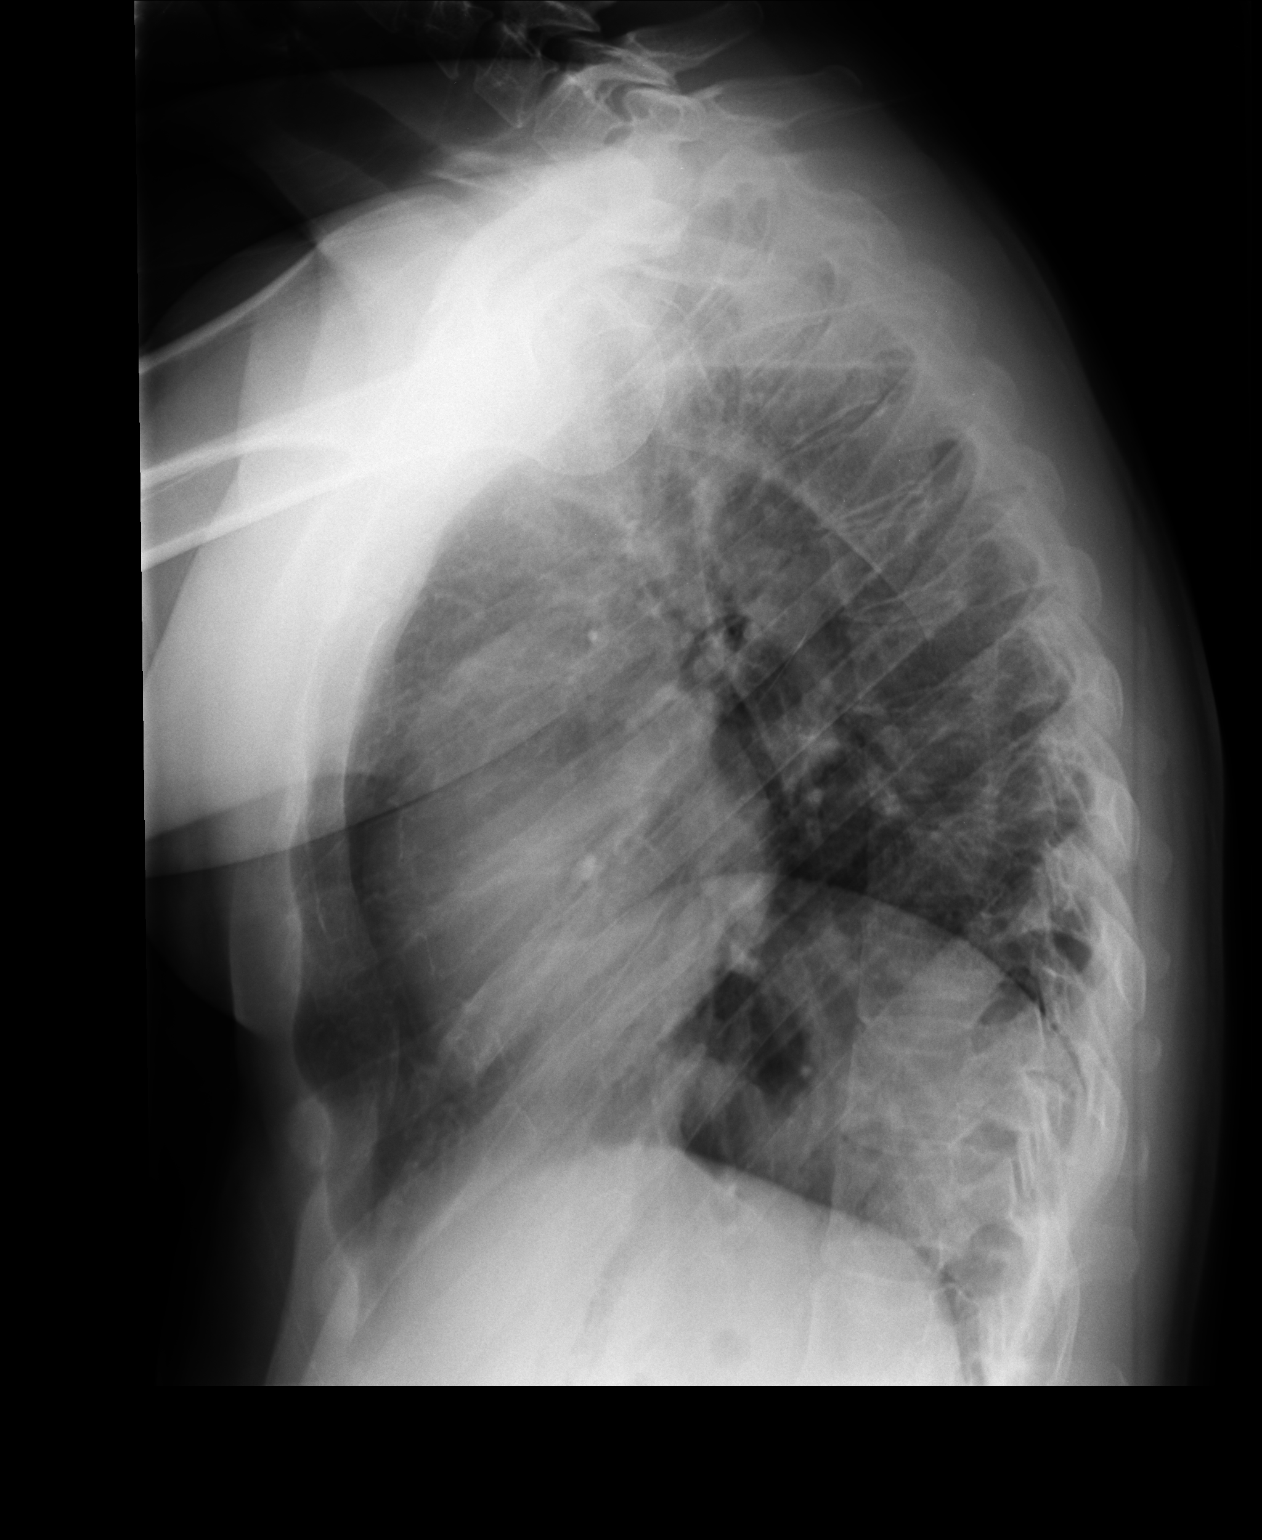

[2 of 2 positions shown; findings below may reference images not displayed]

FINDINGS: Frontal and lateral views were obtained. There is no fracture or
spondylolisthesis. There is slight disc space narrowing at multiple
levels. No erosive change.
IMPRESSION: Slight disc space narrowing at multiple levels. No fracture or
spondylolisthesis.

## 2015-07-19 ENCOUNTER — Ambulatory Visit
Admission: RE | Admit: 2015-07-19 | Discharge: 2015-07-19 | Disposition: A | Discharge status: Home / Self Care | Payer: 59 | Source: Ambulatory Visit

## 2015-07-19 DIAGNOSIS — Z1231 Encounter for screening mammogram for malignant neoplasm of breast: Secondary | ICD-10-CM

## 2015-10-13 ENCOUNTER — Ambulatory Visit (INDEPENDENT_AMBULATORY_CARE_PROVIDER_SITE_OTHER): Payer: 59 | Admitting: Family Medicine

## 2015-10-13 VITALS — BP 118/70 | HR 92 | Temp 98.5°F | Resp 17 | Wt 120.0 lb

## 2015-10-13 DIAGNOSIS — J209 Acute bronchitis, unspecified: Secondary | ICD-10-CM | POA: Diagnosis not present

## 2015-10-13 MED ORDER — AMOXICILLIN-POT CLAVULANATE 875-125 MG PO TABS: 1.0000 | ORAL_TABLET | Freq: Two times a day (BID) | ORAL | Status: DC

## 2015-10-13 MED ORDER — HYDROCOD POLST-CPM POLST ER 10-8 MG/5ML PO SUER: 5.0000 mL | Freq: Two times a day (BID) | ORAL | Status: DC | PRN

## 2015-10-13 MED ORDER — BENZONATATE 100 MG PO CAPS: 100.0000 mg | ORAL_CAPSULE | Freq: Three times a day (TID) | ORAL | Status: DC | PRN

## 2015-10-13 NOTE — Patient Instructions (Signed)

## 2015-10-13 NOTE — Progress Notes (Signed)
Subjective:    Patient ID: [name], female    DOB: Dec 21, 1975, 39 y.o.   MRN: [id number] This chart was scribed for Delman Cheadle, MD by Zola Button, Medical Scribe. This patient was seen in Room 8 and the patient's care was started at 10:55 AM.   Chief Complaint  Patient presents with  . Cough  . Nasal Congestion    HPI HPI Comments: [name] is a 39 y.o. female who presents to the [facility] complaining of gradual onset, minimally productive cough that started 3 weeks ago. Her symptoms began with rhinorrhea, sore throat, and postnasal drip, but she then developed nasal congestion and chest congestion. Patient reports having associated SOB that started 3 days ago and intermittent chills. She has been using guaifenesin DM but without relief. She has not used any prescription medications. Patient denies chest pain. She also denies history of asthma. She is a former smoker and quit about 5 years ago. She has used an inhaler in the past for bronchitis. She prefers Augmentin over Z-pak because she states Z-pak is not effective for her. She has not missed any work.  Patient works at [employer], but does not work around patients.  Past Medical History  Diagnosis Date  . Depression   . Anxiety   . Headache(784.0)     stress related - otc meds prn  . GERD (gastroesophageal reflux disease)     no meds - diet controlled  . Liver cyst   . Ovarian cyst   . Endometriosis   . Fibrocystic breast disease   . PONV (postoperative nausea and vomiting)   . Colon polyps 01/28/2015    Hyperplastic   Current Outpatient Prescriptions on File Prior to Visit  Medication Sig Dispense Refill  . estradiol (ESTRACE) 1 MG tablet Take 1 mg by mouth at bedtime.    . progesterone (PROMETRIUM) 100 MG capsule Take 100 mg by mouth at bedtime.    . traMADol (ULTRAM) 50 MG tablet Take 1 tablet (50 mg total) by mouth every 6 (six) hours as needed. (Patient not taking: Reported on  10/13/2015) 30 tablet 0   No current facility-administered medications on file prior to visit.   Allergies  Allergen Reactions  . Red Dye Anaphylaxis    #40  . Nexium [Esomeprazole Magnesium] Other (See Comments)    Severe migraines  . Neosporin [Neomycin-Bacitracin Zn-Polymyx] Swelling    As child, eyes swollen shut per patient    Review of Systems  Constitutional: Positive for chills.  HENT: Positive for congestion, postnasal drip, rhinorrhea and sore throat.   Respiratory: Positive for cough and shortness of breath.   Cardiovascular: Negative for chest pain.       Objective:  BP 118/70 mmHg  Pulse 92  Temp(Src) 98.5 F (36.9 C) (Oral)  Resp 17  Wt 120 lb (54.432 kg)  SpO2 98%  LMP 12/16/2013  Physical Exam  Constitutional: She is oriented to person, place, and time. She appears well-developed and well-nourished. No distress.  HENT:  Head: Normocephalic and atraumatic.  Right Ear: Tympanic membrane normal.  Left Ear: Tympanic membrane normal.  Nose: Nose normal.  Mouth/Throat: Oropharynx is clear and moist. No oropharyngeal exudate, posterior oropharyngeal edema or posterior oropharyngeal erythema.  Eyes: Pupils are equal, round, and reactive to light.  Neck: Neck supple. No thyromegaly present.  Normal thyroid. Mild cervical adenopathy, TTP.  Cardiovascular: Normal rate, regular rhythm and normal heart sounds.   No murmur heard.  Pulmonary/Chest: Effort normal and breath sounds normal. No respiratory distress. She has no wheezes. She has no rales.  Good air movement. Clear to auscultation bilaterally.   Musculoskeletal: She exhibits no edema.  Lymphadenopathy:    She has cervical adenopathy.  Neurological: She is alert and oriented to person, place, and time. No cranial nerve deficit.  Skin: Skin is warm and dry. No rash noted.  Psychiatric: She has a normal mood and affect. Her behavior is normal.  Nursing note and vitals reviewed.         Assessment &  Plan:   1. Acute bronchitis, unspecified organism     Meds ordered this encounter  Medications  . amoxicillin-clavulanate (AUGMENTIN) 875-125 MG tablet    Sig: Take 1 tablet by mouth 2 (two) times daily.    Dispense:  20 tablet    Refill:  0  . benzonatate (TESSALON) 100 MG capsule    Sig: Take 1-2 capsules (100-200 mg total) by mouth 3 (three) times daily as needed for cough.    Dispense:  40 capsule    Refill:  0  . chlorpheniramine-HYDROcodone (TUSSIONEX PENNKINETIC ER) 10-8 MG/5ML SUER    Sig: Take 5 mLs by mouth every 12 (twelve) hours as needed for cough.    Dispense:  90 mL    Refill:  0    I personally performed the services described in this documentation, which was scribed in my presence. The recorded information has been reviewed and considered, and addended by me as needed.  Delman Cheadle, MD MPH  By signing my name below, I, Zola Button, attest that this documentation has been prepared under the direction and in the presence of Delman Cheadle, MD.  Electronically Signed: Zola Button, Medical Scribe. 10/13/2015. 10:55 AM.

## 2016-06-19 ENCOUNTER — Encounter (HOSPITAL_COMMUNITY): Payer: Self-pay

## 2016-06-19 ENCOUNTER — Encounter (HOSPITAL_COMMUNITY)
Admission: RE | Admit: 2016-06-19 | Discharge: 2016-06-19 | Disposition: A | Discharge status: Home / Self Care | Payer: 59 | Source: Ambulatory Visit | Attending: Orthopedic Surgery | Admitting: Orthopedic Surgery

## 2016-06-19 DIAGNOSIS — X58XXXA Exposure to other specified factors, initial encounter: Secondary | ICD-10-CM | POA: Diagnosis not present

## 2016-06-19 DIAGNOSIS — S73192A Other sprain of left hip, initial encounter: Secondary | ICD-10-CM | POA: Diagnosis not present

## 2016-06-19 DIAGNOSIS — Z0183 Encounter for blood typing: Secondary | ICD-10-CM | POA: Diagnosis not present

## 2016-06-19 DIAGNOSIS — Z01812 Encounter for preprocedural laboratory examination: Secondary | ICD-10-CM | POA: Insufficient documentation

## 2016-06-19 HISTORY — DX: Unspecified osteoarthritis, unspecified site: M19.90

## 2016-06-19 LAB — SURGICAL PCR SCREEN
MRSA, PCR: NEGATIVE
Staphylococcus aureus: NEGATIVE

## 2016-06-19 LAB — BASIC METABOLIC PANEL
ANION GAP: 7 (ref 5–15)
BUN: 11 mg/dL (ref 6–20)
CALCIUM: 9.7 mg/dL (ref 8.9–10.3)
CO2: 29 mmol/L (ref 22–32)
Chloride: 105 mmol/L (ref 101–111)
Creatinine, Ser: 0.9 mg/dL (ref 0.44–1.00)
Glucose, Bld: 81 mg/dL (ref 65–99)
Potassium: 3.8 mmol/L (ref 3.5–5.1)
SODIUM: 141 mmol/L (ref 135–145)

## 2016-06-19 LAB — CBC
HCT: 41.6 % (ref 36.0–46.0)
HEMOGLOBIN: 13.9 g/dL (ref 12.0–15.0)
MCH: 32.2 pg (ref 26.0–34.0)
MCHC: 33.4 g/dL (ref 30.0–36.0)
MCV: 96.3 fL (ref 78.0–100.0)
Platelets: 202 10*3/uL (ref 150–400)
RBC: 4.32 MIL/uL (ref 3.87–5.11)
RDW: 13.4 % (ref 11.5–15.5)
WBC: 9 10*3/uL (ref 4.0–10.5)

## 2016-06-19 LAB — ABO/RH: ABO/RH(D): O POS

## 2016-06-19 NOTE — Patient Instructions (Addendum)
[name]  06/19/2016   Your procedure is scheduled on: Tuesday 06/25/2016  Report to [facility]  Entrance take [facility]  elevators to 3rd floor to  [place] at  0600 AM.  Call this number if you have problems the morning of surgery [phone]   Remember: ONLY 1 PERSON MAY GO WITH YOU TO SHORT STAY TO GET  READY MORNING OF [name].   Do not eat food or drink liquids :After Midnight.     Take these medicines the morning of surgery with A SIP OF WATER: none                                You may not have any metal on your body including hair pins and              piercings  Do not wear jewelry, make-up, lotions, powders or perfumes, deodorant             Do not wear nail polish.  Do not shave  48 hours prior to surgery.              Men may shave face and neck.   Do not bring valuables to the hospital. [name].  Contacts, dentures or bridgework may not be worn into surgery.  Leave suitcase in the car. After surgery it may be brought to your room.   Patient going home after surgery:  Spouse- [name]               Please read over the following fact sheets you were given: _____________________________________________________________________             [name] - Preparing for Surgery Before surgery, you can play an important role.  Because skin is not sterile, your skin needs to be as free of germs as possible.  You can reduce the number of germs on your skin by washing with CHG (chlorahexidine gluconate) soap before surgery.  CHG is an antiseptic cleaner which kills germs and bonds with the skin to continue killing germs even after washing. Please DO NOT use if you have an allergy to CHG or antibacterial soaps.  If your skin becomes reddened/irritated stop using the CHG and inform your nurse when you arrive at Short Stay. Do not shave (including legs and underarms) for at least 48 hours  prior to the first CHG shower.  You may shave your face/neck. Please follow these instructions carefully:  1.  Shower with CHG Soap the night before surgery and the  morning of Surgery.  2.  If you choose to wash your hair, wash your hair first as usual with your  normal  shampoo.  3.  After you shampoo, rinse your hair and body thoroughly to remove the  shampoo.                           4.  Use CHG as you would any other liquid soap.  You can apply chg directly  to the skin and wash                       Gently with a scrungie  or clean washcloth.  5.  Apply the CHG Soap to your body ONLY FROM THE NECK DOWN.   Do not use on face/ open                           Wound or open sores. Avoid contact with eyes, ears mouth and genitals (private parts).                       Wash face,  Genitals (private parts) with your normal soap.             6.  Wash thoroughly, paying special attention to the area where your surgery  will be performed.  7.  Thoroughly rinse your body with warm water from the neck down.  8.  DO NOT shower/wash with your normal soap after using and rinsing off  the CHG Soap.                9.  Pat yourself dry with a clean towel.            10.  Wear clean pajamas.            11.  Place clean sheets on your bed the night of your first shower and do not  sleep with pets. Day of Surgery : Do not apply any lotions/deodorants the morning of surgery.  Please wear clean clothes to the hospital/surgery center.  FAILURE TO FOLLOW THESE INSTRUCTIONS MAY RESULT IN THE CANCELLATION OF YOUR SURGERY PATIENT SIGNATURE_________________________________  NURSE SIGNATURE__________________________________  ________________________________________________________________________   [name]  An incentive spirometer is a tool that can help keep your lungs clear and active. This tool measures how well you are filling your lungs with each breath. Taking long deep breaths may help reverse  or decrease the chance of developing breathing (pulmonary) problems (especially infection) following:  A long period of time when you are unable to move or be active. BEFORE THE PROCEDURE   If the spirometer includes an indicator to show your best effort, your nurse or respiratory therapist will set it to a desired goal.  If possible, sit up straight or lean slightly forward. Try not to slouch.  Hold the incentive spirometer in an upright position. INSTRUCTIONS FOR USE  1. Sit on the edge of your bed if possible, or sit up as far as you can in bed or on a chair. 2. Hold the incentive spirometer in an upright position. 3. Breathe out normally. 4. Place the mouthpiece in your mouth and seal your lips tightly around it. 5. Breathe in slowly and as deeply as possible, raising the piston or the ball toward the top of the column. 6. Hold your breath for 3-5 seconds or for as long as possible. Allow the piston or ball to fall to the bottom of the column. 7. Remove the mouthpiece from your mouth and breathe out normally. 8. Rest for a few seconds and repeat Steps 1 through 7 at least 10 times every 1-2 hours when you are awake. Take your time and take a few normal breaths between deep breaths. 9. The spirometer may include an indicator to show your best effort. Use the indicator as a goal to work toward during each repetition. 10. After each set of 10 deep breaths, practice coughing to be sure your lungs are clear. If you have an incision (the cut made at the time of surgery), support your incision when  coughing by placing a pillow or rolled up towels firmly against it. Once you are able to get out of bed, walk around indoors and cough well. You may stop using the incentive spirometer when instructed by your caregiver.  RISKS AND COMPLICATIONS  Take your time so you do not get dizzy or light-headed.  If you are in pain, you may need to take or ask for pain medication before doing incentive  spirometry. It is harder to take a deep breath if you are having pain. AFTER USE  Rest and breathe slowly and easily.  It can be helpful to keep track of a log of your progress. Your caregiver can provide you with a simple table to help with this. If you are using the spirometer at home, follow these instructions: [employer] IF:   You are having difficultly using the spirometer.  You have trouble using the spirometer as often as instructed.  Your pain medication is not giving enough relief while using the spirometer.  You develop fever of 100.5 F (38.1 C) or higher. SEEK IMMEDIATE MEDICAL CARE IF:   You cough up bloody sputum that had not been present before.  You develop fever of 102 F (38.9 C) or greater.  You develop worsening pain at or near the incision site. MAKE SURE YOU:   Understand these instructions.  Will watch your condition.  Will get help right away if you are not doing well or get worse. Document Released: 03/24/2007 Document Revised: 02/03/2012 Document Reviewed: 05/25/2007 Exit[employer] Patient Information 2014 Exit[employer], [place].   ________________________________________________________________________  WHAT IS A BLOOD TRANSFUSION? Blood Transfusion Information  A transfusion is the replacement of blood or some of its parts. Blood is made up of multiple cells which provide different functions.  Red blood cells carry oxygen and are used for blood loss replacement.  White blood cells fight against infection.  Platelets control bleeding.  Plasma helps clot blood.  Other blood products are available for specialized needs, such as hemophilia or other clotting disorders. BEFORE THE TRANSFUSION  Who gives blood for transfusions?   Healthy volunteers who are fully evaluated to make sure their blood is safe. This is blood bank blood. Transfusion therapy is the safest it has ever been in the practice of medicine. Before blood is taken from a donor, a  complete history is taken to make sure that person has no history of diseases nor engages in risky social behavior (examples are intravenous drug use or sexual activity with multiple partners). The donor's travel history is screened to minimize risk of transmitting infections, such as malaria. The donated blood is tested for signs of infectious diseases, such as HIV and hepatitis. The blood is then tested to be sure it is compatible with you in order to minimize the chance of a transfusion reaction. If you or a relative donates blood, this is often done in anticipation of surgery and is not appropriate for emergency situations. It takes many days to process the donated blood. RISKS AND COMPLICATIONS Although transfusion therapy is very safe and saves many lives, the main dangers of transfusion include:   Getting an infectious disease.  Developing a transfusion reaction. This is an allergic reaction to something in the blood you were given. Every precaution is taken to prevent this. The decision to have a blood transfusion has been considered carefully by your caregiver before blood is given. Blood is not given unless the benefits outweigh the risks. AFTER THE TRANSFUSION  Right after receiving  a blood transfusion, you will usually feel much better and more energetic. This is especially true if your red blood cells have gotten low (anemic). The transfusion raises the level of the red blood cells which carry oxygen, and this usually causes an energy increase.  The nurse administering the transfusion will monitor you carefully for complications. HOME CARE INSTRUCTIONS  No special instructions are needed after a transfusion. You may find your energy is better. Speak with your caregiver about any limitations on activity for underlying diseases you may have. SEEK MEDICAL CARE IF:   Your condition is not improving after your transfusion.  You develop redness or irritation at the intravenous (IV)  site. SEEK IMMEDIATE MEDICAL CARE IF:  Any of the following symptoms occur over the next 12 hours:  Shaking chills.  You have a temperature by mouth above 102 F (38.9 C), not controlled by medicine.  Chest, back, or muscle pain.  People around you feel you are not acting correctly or are confused.  Shortness of breath or difficulty breathing.  Dizziness and fainting.  You get a rash or develop hives.  You have a decrease in urine output.  Your urine turns a dark color or changes to pink, red, or brown. Any of the following symptoms occur over the next 10 days:  You have a temperature by mouth above 102 F (38.9 C), not controlled by medicine.  Shortness of breath.  Weakness after normal activity.  The white part of the eye turns yellow (jaundice).  You have a decrease in the amount of urine or are urinating less often.  Your urine turns a dark color or changes to pink, red, or brown. Document Released: 11/08/2000 Document Revised: 02/03/2012 Document Reviewed: 06/27/2008 [facility] Patient Information 2014 [place], [place].  _______________________________________________________________________

## 2016-06-20 NOTE — H&P (Signed)
TOTAL HIP ADMISSION H&P  Patient is admitted for left total hip arthroplasty, anterior approach.  Subjective:  Chief Complaint: Left hip primary OA / pain  HPI: [name], 40 y.o. female, has a history of pain and functional disability in the left hip(s) due to arthritis and patient has failed non-surgical conservative treatments for greater than 12 weeks to include NSAID's and/or analgesics, corticosteriod injections and activity modification.  Onset of symptoms was gradual starting 9+ months ago with rapidlly worsening course since that time.The patient noted no past surgery on the left hip(s).  Patient currently rates pain in the left hip at 8 out of 10 with activity. Patient has night pain, worsening of pain with activity and weight bearing, trendelenberg gait, pain that interfers with activities of daily living and pain with passive range of motion. Patient has evidence of periarticular osteophytes and joint space narrowing by imaging studies. This condition presents safety issues increasing the risk of falls.  There is no current active infection.   Risks, benefits and expectations were discussed with the patient.  Risks including but not limited to the risk of anesthesia, blood clots, nerve damage, blood vessel damage, failure of the prosthesis, infection and up to and including death.  Patient understand the risks, benefits and expectations and wishes to proceed with surgery.   PCP: Darlyn Chamber, MD  D/C Plans:      Home  Post-op Meds:       No Rx given  Tranexamic Acid:      To be given - IV  Decadron:      Is to be given  FYI:     ASA  No Reglan / Add Phenergan  Norco    Patient Active Problem List   Diagnosis Date Noted  . RUQ pain 01/15/2015  . Endometriosis of pelvis 09/21/2014    Class: Question of   Past Medical History:  Diagnosis Date  . Anxiety   . Arthritis   . Colon polyps 01/28/2015   Hyperplastic  . Depression    death of a child  . Endometriosis   .  Fibrocystic breast disease   . GERD (gastroesophageal reflux disease)    no meds - diet controlled  . Headache(784.0)    stress related - otc meds prn  . Liver cyst   . Ovarian cyst   . PONV (postoperative nausea and vomiting)    with spinal -with C-sections-  Scoplolamine patch and Phenergan works better    Past Surgical History:  Procedure Laterality Date  . CESAREAN SECTION  2002, 2011   x 2  . DIAGNOSTIC LAPAROSCOPY     x 3 surg for ovarian cysts/endometriosis  . LAPAROSCOPY N/A 09/21/2014   Procedure: LAPAROSCOPY DIAGNOSTIC,WITH CAUTERY OF ENDOMETRIAL IMPLANTS;  Surgeon: Darlyn Chamber, MD;  Location: [facility];  Service: Gynecology;  Laterality: N/A;  . MOUTH SURGERY     due to cyst in jaw; removed wisdom tooth  . SCAR REVISION  04/08/2012   Procedure: SCAR REVISION;  Surgeon: Darlyn Chamber, MD;  Location: [facility];  Service: Gynecology;  Laterality: N/A;  c section incision revision  . TONSILLECTOMY AND ADENOIDECTOMY      No prescriptions prior to admission.   Allergies  Allergen Reactions  . Red Dye Anaphylaxis    #40  . Nexium [Esomeprazole Magnesium] Other (See Comments)    Severe migraines  . Neosporin [Neomycin-Bacitracin Zn-Polymyx] Swelling    As child, eyes swollen shut per patient    Social History  Substance Use Topics  . Smoking status: Former Smoker    Packs/day: 0.50    Years: 20.00    Types: Cigarettes    Quit date: 11/25/2010  . Smokeless tobacco: Never Used  . Alcohol use No    Family History  Problem Relation Age of Onset  . Colon cancer Mother   . Heart disease Mother   . Cancer - Other Son     Neuroblastoma  . Heart disease Father   . Other Father     [employer] Disease  . Diabetes Neg Hx   . Pancreatic cancer Neg Hx   . Colitis Neg Hx   . Crohn's disease Neg Hx   . Celiac disease Neg Hx      Review of Systems  Constitutional: Negative.   Eyes: Negative.   Respiratory: Negative.   Cardiovascular: Negative.   Gastrointestinal:  Positive for heartburn.  Genitourinary: Negative.   Musculoskeletal: Positive for joint pain.  Skin: Negative.   Neurological: Positive for headaches.  Endo/Heme/Allergies: Negative.   Psychiatric/Behavioral: Positive for depression. The patient is nervous/anxious.     Objective:  Physical Exam  Constitutional: She is oriented to person, place, and time. She appears well-developed.  HENT:  Head: Normocephalic.  Eyes: Pupils are equal, round, and reactive to light.  Neck: Neck supple. No JVD present. No tracheal deviation present. No thyromegaly present.  Cardiovascular: Normal rate, regular rhythm, normal heart sounds and intact distal pulses.   Respiratory: Effort normal and breath sounds normal. No respiratory distress. She has no wheezes.  GI: Soft. There is no tenderness. There is no guarding.  Musculoskeletal:       Left hip: She exhibits decreased range of motion, decreased strength, tenderness and bony tenderness. She exhibits no swelling and no laceration.  Lymphadenopathy:    She has no cervical adenopathy.  Neurological: She is alert and oriented to person, place, and time.  Skin: Skin is warm and dry.  Psychiatric: She has a normal mood and affect.    Vital signs in last 24 hours: Temp:  [98.6 F (37 C)] 98.6 F (37 C) (07/26 1311) Pulse Rate:  [57] 57 (07/26 1311) Resp:  [16] 16 (07/26 1311) BP: (104)/(48) 104/48 (07/26 1311) SpO2:  [100 %] 100 % (07/26 1311) Weight:  [49.3 kg (108 lb 11.2 oz)] 49.3 kg (108 lb 11.2 oz) (07/26 1311)  Labs:   Estimated body mass index is 19.26 kg/m as calculated from the following:   Height as of 06/19/16: 5\' 3"  (1.6 m).   Weight as of 06/19/16: 49.3 kg (108 lb 11.2 oz).   Imaging Review Plain radiographs demonstrate severe degenerative joint disease of the left hip(s). The bone quality appears to be good for age and reported activity level.  Assessment/Plan:  End stage arthritis, left hip(s)  The patient history,  physical examination, clinical judgement of the provider and imaging studies are consistent with end stage degenerative joint disease of the left hip(s) and total hip arthroplasty is deemed medically necessary. The treatment options including medical management, injection therapy, arthroscopy and arthroplasty were discussed at length. The risks and benefits of total hip arthroplasty were presented and reviewed. The risks due to aseptic loosening, infection, stiffness, dislocation/subluxation,  thromboembolic complications and other imponderables were discussed.  The patient acknowledged the explanation, agreed to proceed with the plan and consent was signed. Patient is being admitted for inpatient treatment for surgery, pain control, PT, OT, prophylactic antibiotics, VTE prophylaxis, progressive ambulation and ADL's and discharge planning.The patient  is planning to be discharged home.     [place] Geovanna Simko   PA-C  06/20/2016, 10:12 AM

## 2016-06-25 ENCOUNTER — Ambulatory Visit (HOSPITAL_COMMUNITY): Payer: 59

## 2016-06-25 ENCOUNTER — Encounter (HOSPITAL_COMMUNITY): Payer: Self-pay | Admitting: Anesthesiology

## 2016-06-25 ENCOUNTER — Encounter (HOSPITAL_COMMUNITY)
Admission: RE | Disposition: A | Discharge status: Home / Self Care | Payer: Self-pay | Source: Ambulatory Visit | Attending: Orthopedic Surgery

## 2016-06-25 ENCOUNTER — Ambulatory Visit (HOSPITAL_COMMUNITY): Payer: 59 | Admitting: Anesthesiology

## 2016-06-25 ENCOUNTER — Ambulatory Visit (HOSPITAL_COMMUNITY)
Admission: RE | Admit: 2016-06-25 | Discharge: 2016-06-25 | Disposition: A | Discharge status: Home / Self Care | Payer: 59 | Source: Ambulatory Visit | Attending: Orthopedic Surgery | Admitting: Orthopedic Surgery

## 2016-06-25 DIAGNOSIS — Z87891 Personal history of nicotine dependence: Secondary | ICD-10-CM | POA: Diagnosis not present

## 2016-06-25 DIAGNOSIS — X58XXXA Exposure to other specified factors, initial encounter: Secondary | ICD-10-CM | POA: Insufficient documentation

## 2016-06-25 DIAGNOSIS — Z79899 Other long term (current) drug therapy: Secondary | ICD-10-CM | POA: Diagnosis not present

## 2016-06-25 DIAGNOSIS — M25552 Pain in left hip: Secondary | ICD-10-CM

## 2016-06-25 DIAGNOSIS — S73102A Unspecified sprain of left hip, initial encounter: Secondary | ICD-10-CM | POA: Diagnosis not present

## 2016-06-25 DIAGNOSIS — Z7982 Long term (current) use of aspirin: Secondary | ICD-10-CM | POA: Insufficient documentation

## 2016-06-25 DIAGNOSIS — M1612 Unilateral primary osteoarthritis, left hip: Secondary | ICD-10-CM | POA: Diagnosis present

## 2016-06-25 DIAGNOSIS — Z96649 Presence of unspecified artificial hip joint: Secondary | ICD-10-CM

## 2016-06-25 HISTORY — PX: TOTAL HIP ARTHROPLASTY: SHX124

## 2016-06-25 LAB — TYPE AND SCREEN
ABO/RH(D): O POS
Antibody Screen: NEGATIVE

## 2016-06-25 SURGERY — ARTHROPLASTY, HIP, TOTAL, ANTERIOR APPROACH
Anesthesia: Spinal | Site: Hip | Laterality: Left

## 2016-06-25 MED ORDER — CEFAZOLIN SODIUM-DEXTROSE 2-4 GM/100ML-% IV SOLN
2.0000 g | INTRAVENOUS | Status: AC
  Administered 2016-06-25: 2 g via INTRAVENOUS

## 2016-06-25 MED ORDER — FENTANYL CITRATE (PF) 100 MCG/2ML IJ SOLN
INTRAMUSCULAR | Status: AC
  Filled 2016-06-25: qty 2

## 2016-06-25 MED ORDER — PROPOFOL 500 MG/50ML IV EMUL
INTRAVENOUS | Status: DC | PRN
  Administered 2016-06-25: 75 ug/kg/min via INTRAVENOUS

## 2016-06-25 MED ORDER — FENTANYL CITRATE (PF) 100 MCG/2ML IJ SOLN
INTRAMUSCULAR | Status: DC | PRN
  Administered 2016-06-25 (×2): 50 ug via INTRAVENOUS

## 2016-06-25 MED ORDER — SCOPOLAMINE 1 MG/3DAYS TD PT72
MEDICATED_PATCH | TRANSDERMAL | Status: DC | PRN
  Administered 2016-06-25: 1 via TRANSDERMAL

## 2016-06-25 MED ORDER — MIDAZOLAM HCL 2 MG/2ML IJ SOLN
INTRAMUSCULAR | Status: AC
  Filled 2016-06-25: qty 2

## 2016-06-25 MED ORDER — METHOCARBAMOL 500 MG PO TABS: 500.0000 mg | ORAL_TABLET | Freq: Four times a day (QID) | ORAL | Status: DC | PRN

## 2016-06-25 MED ORDER — SCOPOLAMINE 1 MG/3DAYS TD PT72
MEDICATED_PATCH | TRANSDERMAL | Status: AC
  Filled 2016-06-25: qty 1

## 2016-06-25 MED ORDER — LIDOCAINE HCL (CARDIAC) 20 MG/ML IV SOLN
INTRAVENOUS | Status: AC
  Filled 2016-06-25: qty 5

## 2016-06-25 MED ORDER — SODIUM CHLORIDE 0.9 % IJ SOLN
INTRAMUSCULAR | Status: AC
  Filled 2016-06-25: qty 10

## 2016-06-25 MED ORDER — TRANEXAMIC ACID 1000 MG/10ML IV SOLN
1000.0000 mg | Freq: Once | INTRAVENOUS | Status: AC
  Administered 2016-06-25: 1000 mg via INTRAVENOUS
  Filled 2016-06-25: qty 10

## 2016-06-25 MED ORDER — LIDOCAINE 2% (20 MG/ML) 5 ML SYRINGE
INTRAMUSCULAR | Status: DC | PRN
  Administered 2016-06-25: 75 mg via INTRAVENOUS

## 2016-06-25 MED ORDER — SODIUM CHLORIDE 0.9 % IV BOLUS (SEPSIS): 500.0000 mL | Freq: Once | INTRAVENOUS | Status: DC

## 2016-06-25 MED ORDER — DEXTROSE 5 % IV SOLN
500.0000 mg | Freq: Four times a day (QID) | INTRAVENOUS | Status: DC | PRN
  Administered 2016-06-25: 500 mg via INTRAVENOUS
  Filled 2016-06-25: qty 5
  Filled 2016-06-25: qty 550

## 2016-06-25 MED ORDER — MIDAZOLAM HCL 5 MG/5ML IJ SOLN
INTRAMUSCULAR | Status: DC | PRN
  Administered 2016-06-25 (×2): 1 mg via INTRAVENOUS

## 2016-06-25 MED ORDER — ONDANSETRON HCL 4 MG/2ML IJ SOLN
INTRAMUSCULAR | Status: AC
  Filled 2016-06-25: qty 2

## 2016-06-25 MED ORDER — CHLORHEXIDINE GLUCONATE 4 % EX LIQD: 60.0000 mL | Freq: Once | CUTANEOUS | Status: DC

## 2016-06-25 MED ORDER — HYDROMORPHONE HCL 2 MG PO TABS: 2.0000 mg | ORAL_TABLET | Freq: Four times a day (QID) | ORAL | 0 refills | Status: DC | PRN

## 2016-06-25 MED ORDER — DEXAMETHASONE SODIUM PHOSPHATE 10 MG/ML IJ SOLN
INTRAMUSCULAR | Status: AC
  Filled 2016-06-25: qty 1

## 2016-06-25 MED ORDER — PHENYLEPHRINE HCL 10 MG/ML IJ SOLN
INTRAVENOUS | Status: DC | PRN
  Administered 2016-06-25: 50 ug/min via INTRAVENOUS

## 2016-06-25 MED ORDER — ONDANSETRON HCL 4 MG/2ML IJ SOLN
INTRAMUSCULAR | Status: DC | PRN
  Administered 2016-06-25: 4 mg via INTRAVENOUS

## 2016-06-25 MED ORDER — TRANEXAMIC ACID 1000 MG/10ML IV SOLN
1000.0000 mg | INTRAVENOUS | Status: AC
  Administered 2016-06-25: 1000 mg via INTRAVENOUS
  Filled 2016-06-25: qty 10

## 2016-06-25 MED ORDER — PROPOFOL 10 MG/ML IV BOLUS
INTRAVENOUS | Status: DC | PRN
  Administered 2016-06-25 (×3): 10 mg via INTRAVENOUS

## 2016-06-25 MED ORDER — SODIUM CHLORIDE 0.9 % IR SOLN
Status: DC | PRN
  Administered 2016-06-25: 1000 mL

## 2016-06-25 MED ORDER — HYDROMORPHONE HCL 1 MG/ML IJ SOLN
0.5000 mg | INTRAMUSCULAR | Status: DC | PRN
  Administered 2016-06-25: 0.5 mg via INTRAVENOUS

## 2016-06-25 MED ORDER — PROMETHAZINE HCL 12.5 MG PO TABS
12.5000 mg | ORAL_TABLET | Freq: Four times a day (QID) | ORAL | Status: DC | PRN
  Filled 2016-06-25: qty 1

## 2016-06-25 MED ORDER — ASPIRIN EC 81 MG PO TBEC: 81.0000 mg | DELAYED_RELEASE_TABLET | Freq: Every day | ORAL | 0 refills | Status: AC

## 2016-06-25 MED ORDER — DEXAMETHASONE SODIUM PHOSPHATE 10 MG/ML IJ SOLN
10.0000 mg | Freq: Once | INTRAMUSCULAR | Status: AC
  Administered 2016-06-25: 5 mg via INTRAVENOUS

## 2016-06-25 MED ORDER — OXYCODONE HCL 5 MG PO TABS
5.0000 mg | ORAL_TABLET | ORAL | Status: DC
  Administered 2016-06-25 (×2): 10 mg via ORAL
  Filled 2016-06-25 (×2): qty 2

## 2016-06-25 MED ORDER — HYDROMORPHONE HCL 1 MG/ML IJ SOLN
INTRAMUSCULAR | Status: AC
  Filled 2016-06-25: qty 1

## 2016-06-25 MED ORDER — ACETAMINOPHEN 500 MG PO TABS: 1000.0000 mg | ORAL_TABLET | Freq: Three times a day (TID) | ORAL | 0 refills | Status: AC

## 2016-06-25 MED ORDER — PROPOFOL 10 MG/ML IV BOLUS
INTRAVENOUS | Status: AC
  Filled 2016-06-25: qty 40

## 2016-06-25 MED ORDER — EPHEDRINE SULFATE 50 MG/ML IJ SOLN
INTRAMUSCULAR | Status: AC
Start: 2016-06-25 — End: 2016-06-25
  Filled 2016-06-25: qty 1

## 2016-06-25 MED ORDER — PROMETHAZINE HCL 25 MG/ML IJ SOLN: 6.2500 mg | Freq: Four times a day (QID) | INTRAMUSCULAR | Status: DC | PRN

## 2016-06-25 MED ORDER — SULFAMETHOXAZOLE-TRIMETHOPRIM 400-80 MG PO TABS: 1.0000 | ORAL_TABLET | Freq: Two times a day (BID) | ORAL | 0 refills | Status: AC

## 2016-06-25 MED ORDER — CEFAZOLIN SODIUM-DEXTROSE 2-4 GM/100ML-% IV SOLN
INTRAVENOUS | Status: AC
  Filled 2016-06-25: qty 100

## 2016-06-25 MED ORDER — BUPIVACAINE IN DEXTROSE 0.75-8.25 % IT SOLN
INTRATHECAL | Status: DC | PRN
  Administered 2016-06-25: 1.6 mL via INTRATHECAL

## 2016-06-25 MED ORDER — LACTATED RINGERS IV SOLN
INTRAVENOUS | Status: DC
  Administered 2016-06-25: 09:00:00 via INTRAVENOUS
  Administered 2016-06-25: 1000 mL via INTRAVENOUS

## 2016-06-25 MED ORDER — OXYCODONE HCL 5 MG PO TABS: 5.0000 mg | ORAL_TABLET | ORAL | 0 refills | Status: DC | PRN

## 2016-06-25 MED ORDER — METHOCARBAMOL 500 MG PO TABS: 500.0000 mg | ORAL_TABLET | Freq: Four times a day (QID) | ORAL | 0 refills | Status: AC | PRN

## 2016-06-25 MED FILL — METHOCARBAMOL 500 MG TABLET: 500 | 12 days supply | Qty: 50 | Fill #0

## 2016-06-25 MED FILL — ASPIR-LOW EC 81 MG TABLET: 81 | 30 days supply | Qty: 30 | Fill #0

## 2016-06-25 MED FILL — HYDROmorphone HCL 2 MG TABS: 2 | 2 days supply | Qty: 16 | Fill #0

## 2016-06-25 MED FILL — SULFAMETHOXAZOLE-TMP SS TAB: 400-80 | 5 days supply | Qty: 10 | Fill #0

## 2016-06-25 MED FILL — oxyCODONE HCL 5 MG TABS: 5 | 8 days supply | Qty: 100 | Fill #0

## 2016-06-25 SURGICAL SUPPLY — 36 items
BAG DECANTER FOR FLEXI CONT (MISCELLANEOUS) IMPLANT
BAG SPEC THK2 15X12 ZIP CLS (MISCELLANEOUS)
BAG ZIPLOCK 12X15 (MISCELLANEOUS) IMPLANT
CAPT HIP TOTAL 3 ×1 IMPLANT
CLOTH BEACON ORANGE TIMEOUT ST (SAFETY) ×2 IMPLANT
COVER PERINEAL POST (MISCELLANEOUS) ×2 IMPLANT
DRAPE STERI IOBAN 125X83 (DRAPES) ×2 IMPLANT
DRAPE U-SHAPE 47X51 STRL (DRAPES) ×4 IMPLANT
DRESSING AQUACEL AG SP 3.5X10 (GAUZE/BANDAGES/DRESSINGS) ×1 IMPLANT
DRSG AQUACEL AG SP 3.5X10 (GAUZE/BANDAGES/DRESSINGS) ×2
DURAPREP 26ML APPLICATOR (WOUND CARE) ×2 IMPLANT
ELECT REM PT RETURN 15FT ADLT (MISCELLANEOUS) IMPLANT
ELECT REM PT RETURN 9FT ADLT (ELECTROSURGICAL) ×2
ELECTRODE REM PT RTRN 9FT ADLT (ELECTROSURGICAL) ×1 IMPLANT
GLOVE BIOGEL M STRL SZ7.5 (GLOVE) IMPLANT
GLOVE BIOGEL PI IND STRL 7.5 (GLOVE) ×1 IMPLANT
GLOVE BIOGEL PI IND STRL 8.5 (GLOVE) ×1 IMPLANT
GLOVE BIOGEL PI INDICATOR 7.5 (GLOVE) ×3
GLOVE BIOGEL PI INDICATOR 8.5 (GLOVE) ×1
GLOVE ECLIPSE 8.0 STRL XLNG CF (GLOVE) ×3 IMPLANT
GLOVE ORTHO TXT STRL SZ7.5 (GLOVE) ×3 IMPLANT
GOWN STRL REUS W/TWL LRG LVL3 (GOWN DISPOSABLE) ×2 IMPLANT
GOWN STRL REUS W/TWL XL LVL3 (GOWN DISPOSABLE) ×2 IMPLANT
HOLDER FOLEY CATH W/STRAP (MISCELLANEOUS) ×2 IMPLANT
LIQUID BAND (GAUZE/BANDAGES/DRESSINGS) ×2 IMPLANT
PACK ANTERIOR HIP CUSTOM (KITS) ×2 IMPLANT
SAW OSC TIP CART 19.5X105X1.3 (SAW) ×2 IMPLANT
SUT MNCRL AB 4-0 PS2 18 (SUTURE) ×2 IMPLANT
SUT VIC AB 1 CT1 36 (SUTURE) ×6 IMPLANT
SUT VIC AB 2-0 CT1 27 (SUTURE) ×4
SUT VIC AB 2-0 CT1 TAPERPNT 27 (SUTURE) ×2 IMPLANT
SUT VLOC 180 0 24IN GS25 (SUTURE) ×2 IMPLANT
TRAY FOLEY W/METER SILVER 14FR (SET/KITS/TRAYS/PACK) ×1 IMPLANT
TRAY FOLEY W/METER SILVER 16FR (SET/KITS/TRAYS/PACK) IMPLANT
WATER STERILE IRR 1500ML POUR (IV SOLUTION) ×2 IMPLANT
YANKAUER SUCT BULB TIP 10FT TU (MISCELLANEOUS) ×1 IMPLANT

## 2016-06-25 NOTE — Anesthesia Postprocedure Evaluation (Signed)
Anesthesia Post Note  Patient: [name]  Procedure(s) Performed: Procedure(s) (LRB): LEFT TOTAL HIP ARTHROPLASTY ANTERIOR APPROACH (Left)  Patient location during evaluation: PACU Anesthesia Type: Spinal Level of consciousness: awake and alert Pain management: pain level controlled Vital Signs Assessment: post-procedure vital signs reviewed and stable Respiratory status: spontaneous breathing and respiratory function stable Cardiovascular status: blood pressure returned to baseline and stable Postop Assessment: no headache, no backache and spinal receding Anesthetic complications: no    Last Vitals:  Vitals:   06/25/16 1139 06/25/16 1153  BP: 103/70 126/65  Pulse: (!) 54 69  Resp: 13 14  Temp:  36.8 C    Last Pain:  Vitals:   06/25/16 1208  TempSrc:   PainSc: 6                  Montez Hageman

## 2016-06-25 NOTE — Op Note (Signed)
NAME:  [name] NO.: [id number]      MEDICAL RECORD NO.: [id number]      FACILITY:  [facility]      PHYSICIAN:  Paralee Cancel D  DATE OF BIRTH:  December 04, 1975     DATE OF PROCEDURE:  06/25/2016                                 OPERATIVE REPORT         PREOPERATIVE DIAGNOSIS: Left  hip symptomatic labral tear.      POSTOPERATIVE DIAGNOSIS:  Left hip symptomatic labral tear.[name]      PROCEDURE:  Left total hip replacement through an anterior approach   utilizing DePuy THR system, component size 64mm pinnacle cup, a size 28 neutral   Ceramax ceramic liner, a size 3 Hi Tri Lock stem with a 28+5 delta ceramic   ball.      SURGEON:  Pietro Cassis. Alvan Dame, M.D.      ASSISTANT:  Danae Orleans, PA-C     ANESTHESIA:  Spinal.      SPECIMENS:  None.      COMPLICATIONS:  None.      BLOOD LOSS:  250 cc     DRAINS:  None.      INDICATION OF THE PROCEDURE:  [name] is a 40 y.o. female who had   presented to office for evaluation of left hip pain.  Radiographs revealed   mild degenerative changes but MRI had revealed labral tear.  After much discussion about options of arthroscopic surgery versus total hip she wished to proceed with arthroplasty option for her current quality of life.  The patient had painful limited range of   motion significantly affecting their overall quality of life.  The idea of more conservative surgical options was not something she felt she wanted to entertain.  The patient was failing to    respond to conservative measures, and at this point was ready   to proceed with more definitive measures.  Consent was obtained for   benefit of pain relief.  Specific risk of infection, DVT, component   failure, dislocation, need for revision surgery, as well discussion of   the anterior versus posterior approach were reviewed.  We also discussed based on her age bearing surface options.  She wished after reviewing pros and cons of  each available to proceed with a ceramic on ceramic option.  Consent was   obtained for benefit of anterior pain relief through an anterior   approach.      PROCEDURE IN DETAIL:  The patient was brought to operative theater.   Once adequate anesthesia, preoperative antibiotics, 2gm of Ancef, 1 gm of Tranexamic Acid, and 10 mg of Decadron administered.   The patient was positioned supine on the OSI Hanna table.  Once adequate   padding of boney process was carried out, we had predraped out the hip, and  used fluoroscopy to confirm orientation of the pelvis and position.      The left hip was then prepped and draped from proximal iliac crest to   mid thigh with shower curtain technique.      Time-out was performed identifying the patient, planned procedure, and   extremity.     An incision was then made 2 cm distal and lateral to the  anterior superior iliac spine extending over the orientation of the   tensor fascia lata muscle and sharp dissection was carried down to the   fascia of the muscle and protractor placed in the soft tissues.      The fascia was then incised.  The muscle belly was identified and swept   laterally and retractor placed along the superior neck.  Following   cauterization of the circumflex vessels and removing some pericapsular   fat, a second cobra retractor was placed on the inferior neck.  A third   retractor was placed on the anterior acetabulum after elevating the   anterior rectus.  A L-capsulotomy was along the line of the   superior neck to the trochanteric fossa, then extended proximally and   distally.  Tag sutures were placed and the retractors were then placed   intracapsular.  We then identified the trochanteric fossa and   orientation of my neck cut, confirmed this radiographically   and then made a neck osteotomy with the femur on traction.  The femoral   head was removed without difficulty or complication.  Traction was let   off and retractors  were placed posterior and anterior around the   acetabulum.      The labrum and foveal tissue were debrided.  I began reaming with a 73mm   reamer and reamed up to 41mm reamer with good bony bed preparation and a 49mm   cup was chosen.  The final 100mm Pinnacle cup was then impacted under fluoroscopy  to confirm the depth of penetration and orientation with respect to   abduction.  A screw was placed followed by the hole eliminator.  The final   40mm Ceramax ceramic liner was impacted with good visualized rim fit.  The cup was positioned anatomically within the acetabular portion of the pelvis.      At this point, the femur was rolled at 80 degrees.  Further capsule was   released off the inferior aspect of the femoral neck.  I then   released the superior capsule proximally.  The hook was placed laterally   along the femur and elevated manually and held in position with the bed   hook.  The leg was then extended and adducted with the leg rolled to 100   degrees of external rotation.  Once the proximal femur was fully   exposed, I used a box osteotome to set orientation.  I then began   broaching with the starting chili pepper broach and passed this by hand and then broached up to 3.  With the 3 broach in place I chose a high offset neck and did trial reductions.  The offset was appropriate, leg lengths   appeared to be equal best matched with this stem and the +5 head ball confirmed radiographically.   Given these findings, I went ahead and dislocated the hip, repositioned all   retractors and positioned the right hip in the extended and abducted position.  The final 3 Hi Tri Lock stem was   chosen and it was impacted down to the level of neck cut.  Based on this   and the trial reduction, a 28+5 delta ceramic ball was chosen and   impacted onto a clean and dry trunnion, and the hip was reduced.  The   hip had been irrigated throughout the case again at this point.  I did   reapproximate the  superior capsular leaflet to the anterior leaflet  using #1 Vicryl.  The fascia of the   tensor fascia lata muscle was then reapproximated using #1 Vicryl and #0 V-lock sutures.  The   remaining wound was closed with 2-0 Vicryl and running 4-0 Monocryl.   The hip was cleaned, dried, and dressed sterilely using Dermabond and   Aquacel dressing.  She was then brought   to recovery room in stable condition tolerating the procedure well.    Danae Orleans, PA-C was present for the entirety of the case involved from   preoperative positioning, perioperative retractor management, general   facilitation of the case, as well as primary wound closure as assistant.            Pietro Cassis Alvan Dame, M.D.        06/25/2016 10:10 AM

## 2016-06-25 NOTE — Evaluation (Signed)
Physical Therapy Evaluation Patient Details Name: [name] MRN: [id number] DOB: 1976-05-15 Today's Date: 06/25/2016   History of Present Illness  40 yo female s/p L THA-direct anterior 06/25/16  Clinical Impression  On eval POD 0, pt required Min assist for mobility. She was able to walk ~90 feet with RW. Pain rated 6/10 with activity. Practiced/reviewed gait training, exercises, and stair training (1 step). Issued HEP for pt to perform 2x/day until follow up visit with MD. Discussed car transfer. All education completed.     Follow Up Recommendations No PT follow up -at this time. OP PT if MD feels it is warranted at follow up    Equipment Recommendations  None recommended by PT    Recommendations for Other Services       Precautions / Restrictions        Mobility  Bed Mobility Overal bed mobility: Needs Assistance Bed Mobility: Supine to Sit;Sit to Supine     Supine to sit: Min assist Sit to supine: Min assist   General bed mobility comments: Assist for L LE getting out of bed. Husband assisted with LEs when getting back into bed. Practiced entering/exiting on R side to simulate home environment  Transfers Overall transfer level: Needs assistance Equipment used: Rolling walker (2 wheeled) Transfers: Sit to/from Stand Sit to Stand: Min assist         General transfer comment: small amount of assist to rise. VCs safety, technique, hand/LE placement  Ambulation/Gait Ambulation/Gait assistance: Supervision Ambulation Distance (Feet): 90 Feet Assistive device: Rolling walker (2 wheeled) Gait Pattern/deviations: Step-to pattern;Decreased step length - left;Antalgic     General Gait Details: VCs safety, sequence, step length. very slow gait speed. No LOB.   Stairs Stairs: Yes Stairs assistance: Min guard Stair Management: Step to pattern;Forwards;With walker Number of Stairs: 1 General stair comments: VCs safety, technique, sequence. close guard for safety.    Wheelchair Mobility    Modified Rankin (Stroke Patients Only)       Balance                                             Pertinent Vitals/Pain Pain Assessment: 0-10 Pain Score: 6  Pain Location: L posterior hip, anterior thigh Pain Descriptors / Indicators: Aching;Sore Pain Intervention(s): Limited activity within patient's tolerance;Repositioned;Ice applied    Home Living Family/patient expects to be discharged to:: Private residence Living Arrangements: Spouse/significant other   Type of Home: House Home Access: Stairs to enter   [employer] of Steps: 1 Home Layout: One level Home Equipment: Environmental consultant - 2 wheels;Bedside commode      Prior Function Level of Independence: Independent               Hand Dominance        Extremity/Trunk Assessment   Upper Extremity Assessment: Overall WFL for tasks assessed           Lower Extremity Assessment: LLE deficits/detail   LLE Deficits / Details: limited hip ROM- ~65-70 degrees flexion sitting EOB (within pt's tolerance).   Cervical / Trunk Assessment: Normal  Communication   Communication: No difficulties  Cognition Arousal/Alertness: Awake/alert Behavior During Therapy: WFL for tasks assessed/performed Overall Cognitive Status: Within Functional Limits for tasks assessed                      General Comments  Exercises Total Joint Exercises Ankle Circles/Pumps: AROM;Both;10 reps;Supine Quad Sets: AROM;Both;10 reps;Supine Heel Slides: AAROM;Left;10 reps;Supine Hip ABduction/ADduction: AAROM;Left;10 reps;Supine      Assessment/Plan    PT Assessment All further PT needs can be met in the next venue of care (OP PT if MD feels it is warranted at follow up)  PT Diagnosis Difficulty walking;Acute pain   PT Problem List Decreased strength;Decreased range of motion;Decreased balance;Decreased activity tolerance;Decreased mobility;Pain  PT Treatment  Interventions     PT Goals (Current goals can be found in the Care Plan section) Acute Rehab PT Goals Patient Stated Goal: less pain, improved hip ROM PT Goal Formulation: All assessment and education complete, DC therapy    Frequency     Barriers to discharge        Co-evaluation               End of Session   Activity Tolerance: Patient limited by pain Patient left: in bed;with call bell/phone within reach;with family/visitor present;with nursing/sitter in room      Functional Assessment Tool Used: clinical judgement Functional Limitation: Mobility: Walking and moving around Mobility: Walking and Moving Around Current Status (Z6109): At least 1 percent but less than 20 percent impaired, limited or restricted Mobility: Walking and Moving Around Goal Status [phone]): At least 1 percent but less than 20 percent impaired, limited or restricted Mobility: Walking and Moving Around Discharge Status [phone]): At least 1 percent but less than 20 percent impaired, limited or restricted    Time: 1508-1559 PT Time Calculation (min) (ACUTE ONLY): 51 min   Charges:   PT Evaluation $PT Eval Low Complexity: 1 Procedure PT Treatments $Gait Training: 23-37 mins $Therapeutic Exercise: 8-22 mins   PT G Codes:   PT G-Codes **NOT FOR INPATIENT CLASS** Functional Assessment Tool Used: clinical judgement Functional Limitation: Mobility: Walking and moving around Mobility: Walking and Moving Around Current Status (B1478): At least 1 percent but less than 20 percent impaired, limited or restricted Mobility: Walking and Moving Around Goal Status [phone]): At least 1 percent but less than 20 percent impaired, limited or restricted Mobility: Walking and Moving Around Discharge Status [phone]): At least 1 percent but less than 20 percent impaired, limited or restricted    Weston Anna, MPT Pager: [phone]

## 2016-06-25 NOTE — Discharge Instructions (Addendum)
Spinal Anesthesia and Epidural Anesthesia, Care After Refer to this sheet in the next few weeks. These instructions provide you with information about caring for yourself after your procedure. Your health care provider may also give you more specific instructions. Your treatment has been planned according to current medical practices, but problems sometimes occur. Call your health care provider if you have any problems or questions after your procedure. WHAT TO EXPECT AFTER THE PROCEDURE After your procedure, it is typical to have the following:  Sleepiness.  Nausea and vomiting. HOME CARE INSTRUCTIONS  For the first 24 hours after anesthetic medicine:  Do not drive or operate heavy machinery.  Do not drink alcohol.  Do not make important decisions.  Have someone stay with you for at least 24-48 hours.  Drink enough fluid to keep your urine clear or pale yellow. SEEK MEDICAL CARE IF:  You have nausea and vomiting that continue the day after anesthetic medicine was given.  You develop a rash. SEEK IMMEDIATE MEDICAL CARE IF:   You have a fever.  You have a persistent or severe headache.  You develop blurred or double vision.  You develop dizziness or lightheadedness.  You faint.  You have weakness, numbness, or tingling in your arms or legs.  You have difficulty breathing.  You are unable to pass urine.   This information is not intended to replace advice given to you by your health care provider. Make sure you discuss any questions you have with your health care provider.   Document Released: 02/01/2004 Document Revised: 12/02/2014 Document Reviewed: 06/22/2014 Elsevier Interactive Patient Education 2016 [place]   o Remove items at home which could result in a fall. This includes throw rugs or furniture in walking pathways o ICE to the affected joint every three hours while awake for 30 minutes at a time, for at least  the first 3-5 days, and then as needed for pain and swelling.  Continue to use ice for pain and swelling. You may notice swelling that will progress down to the foot and ankle.  This is normal after surgery.  Elevate your leg when you are not up walking on it.   o Continue to use the breathing machine you got in the hospital (incentive spirometer) which will help keep your temperature down.  It is common for your temperature to cycle up and down following surgery, especially at night when you are not up moving around and exerting yourself.  The breathing machine keeps your lungs expanded and your temperature down.   DIET:  As you were doing prior to hospitalization, we recommend a well-balanced diet.  DRESSING / WOUND CARE / SHOWERING  Keep the surgical dressing until follow up.  The dressing is water proof, so you can shower without any extra covering.  IF THE DRESSING FALLS OFF or the wound gets wet inside, change the dressing with sterile gauze.  Please use good hand washing techniques before changing the dressing.  Do not use any lotions or creams on the incision until instructed by your surgeon.    ACTIVITY  o Increase activity slowly as tolerated, but follow the weight bearing instructions below.   o No driving for 6 weeks or until further direction given by your physician.  You cannot drive while taking narcotics.  o No lifting or carrying greater than 10 lbs. until further directed by your surgeon. o Avoid periods of inactivity such as sitting longer than an hour when not  asleep. This helps prevent blood clots.  o You may return to work once you are authorized by your doctor.     WEIGHT BEARING   Weight bearing as tolerated with assist device (walker, cane, etc) as directed, use it as long as suggested by your surgeon or therapist, typically at least 4-6 weeks.   EXERCISES  Results after joint replacement surgery are often greatly improved when you follow the exercise, range of  motion and muscle strengthening exercises prescribed by your doctor. Safety measures are also important to protect the joint from further injury. Any time any of these exercises cause you to have increased pain or swelling, decrease what you are doing until you are comfortable again and then slowly increase them. If you have problems or questions, call your caregiver or physical therapist for advice.   Rehabilitation is important following a joint replacement. After just a few days of immobilization, the muscles of the leg can become weakened and shrink (atrophy).  These exercises are designed to build up the tone and strength of the thigh and leg muscles and to improve motion. Often times heat used for twenty to thirty minutes before working out will loosen up your tissues and help with improving the range of motion but do not use heat for the first two weeks following surgery (sometimes heat can increase post-operative swelling).   These exercises can be done on a training (exercise) mat, on the floor, on a table or on a bed. Use whatever works the best and is most comfortable for you.    Use music or television while you are exercising so that the exercises are a pleasant break in your day. This will make your life better with the exercises acting as a break in your routine that you can look forward to.   Perform all exercises about fifteen times, three times per day or as directed.  You should exercise both the operative leg and the other leg as well.  Exercises include:    Quad Sets - Tighten up the muscle on the front of the thigh (Quad) and hold for 5-10 seconds.    Straight Leg Raises - With your knee straight (if you were given a brace, keep it on), lift the leg to 60 degrees, hold for 3 seconds, and slowly lower the leg.  Perform this exercise against resistance later as your leg gets stronger.   Leg Slides: Lying on your back, slowly slide your foot toward your buttocks, bending your knee up  off the floor (only go as far as is comfortable). Then slowly slide your foot back down until your leg is flat on the floor again.   Angel Wings: Lying on your back spread your legs to the side as far apart as you can without causing discomfort.   Hamstring Strength:  Lying on your back, push your heel against the floor with your leg straight by tightening up the muscles of your buttocks.  Repeat, but this time bend your knee to a comfortable angle, and push your heel against the floor.  You may put a pillow under the heel to make it more comfortable if necessary.   A rehabilitation program following joint replacement surgery can speed recovery and prevent re-injury in the future due to weakened muscles. Contact your doctor or a physical therapist for more information on knee rehabilitation.    CONSTIPATION  Constipation is defined medically as fewer than three stools per week and severe constipation as less than one  stool per week.  Even if you have a regular bowel pattern at home, your normal regimen is likely to be disrupted due to multiple reasons following surgery.  Combination of anesthesia, postoperative narcotics, change in appetite and fluid intake all can affect your bowels.   YOU MUST use at least one of the following options; they are listed in order of increasing strength to get the job done.  They are all available over the counter, and you may need to use some, POSSIBLY even all of these options:    Drink plenty of fluids (prune juice may be helpful) and high fiber foods Colace 100 mg by mouth twice a day  Senokot for constipation as directed and as needed Dulcolax (bisacodyl), take with full glass of water  Miralax (polyethylene glycol) once or twice a day as needed.  If you have tried all these things and are unable to have a bowel movement in the first 3-4 days after surgery call either your surgeon or your primary doctor.    If you experience loose stools or diarrhea, hold  the medications until you stool forms back up.  If your symptoms do not get better within 1 week or if they get worse, check with your doctor.  If you experience "the worst abdominal pain ever" or develop nausea or vomiting, please contact the office immediately for further recommendations for treatment.   ITCHING:  If you experience itching with your medications, try taking only a single pain pill, or even half a pain pill at a time.  You can also use Benadryl over the counter for itching or also to help with sleep.   TED HOSE STOCKINGS:  Use stockings on both legs until for at least 2 weeks or as directed by physician office. They may be removed at night for sleeping.  MEDICATIONS:  See your medication summary on the After Visit Summary that nursing will review with you.  You may have some home medications which will be placed on hold until you complete the course of blood thinner medication.  It is important for you to complete the blood thinner medication as prescribed.  PRECAUTIONS:  If you experience chest pain or shortness of breath - call 911 immediately for transfer to the hospital emergency department.   If you develop a fever greater that 101 F, purulent drainage from wound, increased redness or drainage from wound, foul odor from the wound/dressing, or calf pain - CONTACT YOUR SURGEON.                                                   FOLLOW-UP APPOINTMENTS:  If you do not already have a post-op appointment, please call the office for an appointment to be seen by your surgeon.  Guidelines for how soon to be seen are listed in your After Visit Summary, but are typically between 1-4 weeks after surgery.  OTHER INSTRUCTIONS:   Knee Replacement:  Do not place pillow under knee, focus on keeping the knee straight while resting.   MAKE SURE YOU:   Understand these instructions.   Get help right away if you are not doing well or get worse.    Thank you for letting us be a part of  your medical care team.  It is a privilege we respect greatly.  We hope these instructions will help  you stay on track for a fast and full recovery!

## 2016-06-25 NOTE — Progress Notes (Addendum)
Called Dr Alvan Dame to clarify patient holding her hormone medications for her hot flashes. She is to hold them for two weeks to help prevent any blood clots. At her two week follow up, Dr Alvan Dame will address her hormone therapy. Ibuprofen to be held at this time also.  Patient has rolling walker at home. She needs 3 in 1 BSC. Order received to obtain this from [facility].   Discharge instructions reviewed with husband. He verbalizes understanding.  PT is up to see patient.

## 2016-06-25 NOTE — Anesthesia Procedure Notes (Addendum)
Spinal  Patient location during procedure: OR Start time: 06/25/2016 8:42 AM End time: 06/25/2016 8:48 AM Staffing Resident/CRNA: Danley Danker L Performed: resident/CRNA  Preanesthetic Checklist Completed: patient identified, site marked, surgical consent, pre-op evaluation, timeout performed, IV checked, risks and benefits discussed and monitors and equipment checked Spinal Block Patient position: sitting Prep: Betadine Patient monitoring: heart rate, continuous pulse ox and blood pressure Approach: midline Location: L3-4 Injection technique: single-shot Needle Needle type: Sprotte  Needle gauge: 24 G Needle length: 9 cm Assessment Sensory level: T8 Additional Notes Kit expiration date 10/24/2017, lot # [id number] Clear free flow of CSF, negative heme, negative paresthesia Returned to supine position, tolerated well

## 2016-06-25 NOTE — Transfer of Care (Signed)
Immediate Anesthesia Transfer of Care Note  Patient: [name]  Procedure(s) Performed: Procedure(s): LEFT TOTAL HIP ARTHROPLASTY ANTERIOR APPROACH (Left)  Patient Location: PACU  Anesthesia Type:Spinal  Level of Consciousness: awake and alert   Airway & Oxygen Therapy: Patient Spontanous Breathing and Patient connected to face mask oxygen  Post-op Assessment: Report given to RN and Post -op Vital signs reviewed and stable  Post vital signs: Reviewed and stable  Last Vitals:  Vitals:   06/25/16 0601  BP: (!) 99/52  Pulse: (!) 56  Resp: 16  Temp: 37.1 C    Last Pain:  Vitals:   06/25/16 0755  TempSrc:   PainSc: 2       Patients Stated Pain Goal: 3 ([id number])  Complications: No apparent anesthesia complications

## 2016-06-25 NOTE — Anesthesia Preprocedure Evaluation (Addendum)
Anesthesia Evaluation  Patient identified by MRN, date of birth, ID band Patient awake    Reviewed: Allergy & Precautions, H&P , NPO status , Patient's Chart, lab work & pertinent test results, reviewed documented beta blocker date and time   Airway Mallampati: II  TM Distance: >3 FB Neck ROM: full    Dental no notable dental hx.    Pulmonary neg pulmonary ROS, former smoker,    Pulmonary exam normal breath sounds clear to auscultation       Cardiovascular negative cardio ROS Normal cardiovascular exam Rhythm:regular Rate:Normal     Neuro/Psych negative neurological ROS  negative psych ROS   GI/Hepatic negative GI ROS, Neg liver ROS, Enlarged liver from Cyst; takes gabapentin for pain relief   Endo/Other  negative endocrine ROS  Renal/GU negative Renal ROS  negative genitourinary   Musculoskeletal negative musculoskeletal ROS (+)   Abdominal   Peds negative pediatric ROS (+)  Hematology negative hematology ROS (+)   Anesthesia Other Findings   Reproductive/Obstetrics negative OB ROS                            Anesthesia Physical  Anesthesia Plan  ASA: II  Anesthesia Plan: Spinal   Post-op Pain Management:    Induction:   Airway Management Planned: Simple Face Mask  Additional Equipment:   Intra-op Plan:   Post-operative Plan:   Informed Consent: I have reviewed the patients History and Physical, chart, labs and discussed the procedure including the risks, benefits and alternatives for the proposed anesthesia with the patient or authorized representative who has indicated his/her understanding and acceptance.   Dental Advisory Given and Dental advisory given  Plan Discussed with: CRNA and Surgeon  Anesthesia Plan Comments: (  Discussed general anesthesia, including possible nausea, instrumentation of airway, sore throat,pulmonary aspiration, etc. I asked if the were any  outstanding questions, or  concerns before we proceeded. )       Anesthesia Quick Evaluation

## 2016-06-26 ENCOUNTER — Encounter (HOSPITAL_COMMUNITY): Payer: Self-pay | Admitting: Orthopedic Surgery

## 2016-07-02 ENCOUNTER — Encounter (HOSPITAL_COMMUNITY): Payer: Self-pay | Admitting: Orthopedic Surgery

## 2019-09-14 ENCOUNTER — Other Ambulatory Visit: Payer: Self-pay

## 2019-09-14 DIAGNOSIS — Z20822 Contact with and (suspected) exposure to covid-19: Secondary | ICD-10-CM

## 2019-09-15 LAB — NOVEL CORONAVIRUS, NAA: SARS-CoV-2, NAA: NOT DETECTED

## 2019-09-24 ENCOUNTER — Other Ambulatory Visit: Payer: Self-pay

## 2019-09-24 DIAGNOSIS — Z20822 Contact with and (suspected) exposure to covid-19: Secondary | ICD-10-CM

## 2019-09-25 LAB — NOVEL CORONAVIRUS, NAA: SARS-CoV-2, NAA: NOT DETECTED

## 2022-07-30 ENCOUNTER — Other Ambulatory Visit: Payer: Self-pay | Admitting: Obstetrics and Gynecology

## 2022-07-30 DIAGNOSIS — N6320 Unspecified lump in the left breast, unspecified quadrant: Secondary | ICD-10-CM

## 2022-08-07 ENCOUNTER — Ambulatory Visit
Admission: RE | Admit: 2022-08-07 | Discharge: 2022-08-07 | Disposition: A | Discharge status: Home / Self Care | Payer: 59 | Source: Ambulatory Visit | Attending: Obstetrics and Gynecology | Admitting: Obstetrics and Gynecology

## 2022-08-07 DIAGNOSIS — N6320 Unspecified lump in the left breast, unspecified quadrant: Secondary | ICD-10-CM

## 2022-10-23 ENCOUNTER — Other Ambulatory Visit (HOSPITAL_COMMUNITY): Payer: Self-pay

## 2022-10-23 MED ORDER — ESTRADIOL 1 MG PO TABS
1.0000 mg | ORAL_TABLET | Freq: Every day | ORAL | 5 refills | Status: AC
  Filled 2022-10-23: qty 30, 30d supply, fill #0

## 2022-10-23 MED ORDER — PROGESTERONE MICRONIZED 100 MG PO CAPS
100.0000 mg | ORAL_CAPSULE | Freq: Every day | ORAL | 5 refills | Status: AC
  Filled 2022-10-23: qty 30, 30d supply, fill #0

## 2022-11-28 ENCOUNTER — Other Ambulatory Visit: Payer: Self-pay | Admitting: Obstetrics and Gynecology

## 2022-11-28 DIAGNOSIS — Z8249 Family history of ischemic heart disease and other diseases of the circulatory system: Secondary | ICD-10-CM

## 2022-12-30 ENCOUNTER — Ambulatory Visit
Admission: RE | Admit: 2022-12-30 | Discharge: 2022-12-30 | Disposition: A | Discharge status: Home / Self Care | Payer: 59 | Source: Ambulatory Visit | Attending: Obstetrics and Gynecology | Admitting: Obstetrics and Gynecology

## 2022-12-30 DIAGNOSIS — Z8249 Family history of ischemic heart disease and other diseases of the circulatory system: Secondary | ICD-10-CM

## 2023-12-02 ENCOUNTER — Other Ambulatory Visit: Payer: Self-pay | Admitting: Obstetrics and Gynecology

## 2023-12-02 DIAGNOSIS — N6310 Unspecified lump in the right breast, unspecified quadrant: Secondary | ICD-10-CM

## 2023-12-10 ENCOUNTER — Other Ambulatory Visit: Payer: Self-pay | Admitting: Obstetrics and Gynecology

## 2023-12-10 DIAGNOSIS — N6310 Unspecified lump in the right breast, unspecified quadrant: Secondary | ICD-10-CM

## 2023-12-10 DIAGNOSIS — N6320 Unspecified lump in the left breast, unspecified quadrant: Secondary | ICD-10-CM

## 2023-12-11 ENCOUNTER — Other Ambulatory Visit: Payer: 59

## 2024-01-07 ENCOUNTER — Ambulatory Visit
Admission: RE | Admit: 2024-01-07 | Discharge: 2024-01-07 | Disposition: A | Discharge status: Home / Self Care | Payer: 59 | Source: Ambulatory Visit | Attending: Obstetrics and Gynecology | Admitting: Obstetrics and Gynecology

## 2024-01-07 DIAGNOSIS — N6320 Unspecified lump in the left breast, unspecified quadrant: Secondary | ICD-10-CM

## 2024-01-07 DIAGNOSIS — N6310 Unspecified lump in the right breast, unspecified quadrant: Secondary | ICD-10-CM

## 2024-03-06 ENCOUNTER — Other Ambulatory Visit: Payer: Self-pay

## 2024-03-06 ENCOUNTER — Emergency Department (HOSPITAL_COMMUNITY)
Admission: EM | Admit: 2024-03-06 | Discharge: 2024-03-07 | Disposition: A | Discharge status: Home / Self Care | Attending: Emergency Medicine | Admitting: Emergency Medicine

## 2024-03-06 ENCOUNTER — Encounter (HOSPITAL_COMMUNITY): Payer: Self-pay | Admitting: *Deleted

## 2024-03-06 DIAGNOSIS — R21 Rash and other nonspecific skin eruption: Secondary | ICD-10-CM | POA: Insufficient documentation

## 2024-03-06 DIAGNOSIS — R9431 Abnormal electrocardiogram [ECG] [EKG]: Secondary | ICD-10-CM | POA: Insufficient documentation

## 2024-03-06 DIAGNOSIS — E876 Hypokalemia: Secondary | ICD-10-CM | POA: Insufficient documentation

## 2024-03-06 DIAGNOSIS — M542 Cervicalgia: Secondary | ICD-10-CM | POA: Insufficient documentation

## 2024-03-06 DIAGNOSIS — M2550 Pain in unspecified joint: Secondary | ICD-10-CM

## 2024-03-06 NOTE — ED Triage Notes (Signed)
 The pt has had a rash that started yesterday  the rash has been itching  no med has stopped the rash. She was transferred [facility]

## 2024-03-06 NOTE — ED Provider Notes (Signed)
  [place] [facility] Provider Note   CSN: [id number] Arrival date & time: 03/06/24  2012     History {Add pertinent medical, surgical, social history, OB history to HPI:1} Chief Complaint  Patient presents with   Rash    SHATORIA STOOKSBURY is a 48 y.o. female.  HPI     Home Medications Prior to Admission medications   Medication Sig Start Date End Date Taking? Authorizing Provider  acetaminophen (TYLENOL) 500 MG tablet Take 2 tablets (1,000 mg total) by mouth every 8 (eight) hours. 06/25/16   Equilla Hastings, PA-C  estradiol (ESTRACE) 1 MG tablet Take 1 tablet (1 mg total) by mouth daily. 10/23/22     HYDROmorphone (DILAUDID) 2 MG tablet Take 1-2 tablets (2-4 mg total) by mouth every 6 (six) hours as needed for severe pain (breakthrough pain). 06/25/16   Equilla Hastings, PA-C  methocarbamol (ROBAXIN) 500 MG tablet Take 1 tablet (500 mg total) by mouth every 6 (six) hours as needed for muscle spasms. 06/25/16   Equilla Hastings, PA-C  oxyCODONE (OXY IR/ROXICODONE) 5 MG immediate release tablet Take 1-2 tablets (5-10 mg total) by mouth every 4 (four) hours as needed for moderate pain or severe pain. 06/25/16   Equilla Hastings, PA-C  progesterone (PROMETRIUM) 100 MG capsule Take 1 capsule (100 mg total) by mouth at bedtime. 10/23/22     sulfamethoxazole-trimethoprim (BACTRIM) 400-80 MG tablet Take 1 tablet by mouth 2 (two) times daily. 06/25/16   Equilla Hastings, PA-C      Allergies    Red dye #40 (allura red), Nexium [esomeprazole magnesium], and Neosporin [neomycin-bacitracin zn-polymyx]    Review of Systems   Review of Systems  Physical Exam Updated Vital Signs BP 121/77 (BP Location: Right Arm)   Pulse 77   Temp 98.3 F (36.8 C)   Resp 16   Ht 5\' 3"  (1.6 m)   Wt 49.3 kg   LMP 12/16/2013   SpO2 100%   BMI 19.25 kg/m  Physical Exam  ED Results / Procedures / Treatments   Labs (all labs ordered are listed, but only abnormal results are  displayed) Labs Reviewed - No data to display  EKG None  Radiology No results found.  Procedures Procedures  {Document cardiac monitor, telemetry assessment procedure when appropriate:1}  Medications Ordered in ED Medications - No data to display  ED Course/ Medical Decision Making/ A&P   {   Click here for ABCD2, HEART and other calculatorsREFRESH Note before signing :1}                              Medical Decision Making  ***  {Document critical care time when appropriate:1} {Document review of labs and clinical decision tools ie heart score, Chads2Vasc2 etc:1}  {Document your independent review of radiology images, and any outside records:1} {Document your discussion with family members, caretakers, and with consultants:1} {Document social determinants of health affecting pt's care:1} {Document your decision making why or why not admission, treatments were needed:1} Final Clinical Impression(s) / ED Diagnoses Final diagnoses:  None    Rx / DC Orders ED Discharge Orders     None

## 2024-03-07 ENCOUNTER — Emergency Department (HOSPITAL_COMMUNITY)

## 2024-03-07 LAB — CBC WITH DIFFERENTIAL/PLATELET
Abs Immature Granulocytes: 0.03 10*3/uL (ref 0.00–0.07)
Basophils Absolute: 0 10*3/uL (ref 0.0–0.1)
Basophils Relative: 0 %
Eosinophils Absolute: 0.1 10*3/uL (ref 0.0–0.5)
Eosinophils Relative: 2 %
HCT: 37.5 % (ref 36.0–46.0)
Hemoglobin: 13.1 g/dL (ref 12.0–15.0)
Immature Granulocytes: 1 %
Lymphocytes Relative: 29 %
Lymphs Abs: 1.8 10*3/uL (ref 0.7–4.0)
MCH: 33 pg (ref 26.0–34.0)
MCHC: 34.9 g/dL (ref 30.0–36.0)
MCV: 94.5 fL (ref 80.0–100.0)
Monocytes Absolute: 0.1 10*3/uL (ref 0.1–1.0)
Monocytes Relative: 2 %
Neutro Abs: 4.2 10*3/uL (ref 1.7–7.7)
Neutrophils Relative %: 66 %
Platelets: 169 10*3/uL (ref 150–400)
RBC: 3.97 MIL/uL (ref 3.87–5.11)
RDW: 12.9 % (ref 11.5–15.5)
Smear Review: NORMAL
WBC: 6.3 10*3/uL (ref 4.0–10.5)
nRBC: 0 % (ref 0.0–0.2)

## 2024-03-07 LAB — URINALYSIS, W/ REFLEX TO CULTURE (INFECTION SUSPECTED)
Bacteria, UA: NONE SEEN
Bilirubin Urine: NEGATIVE
Glucose, UA: NEGATIVE mg/dL
Hgb urine dipstick: NEGATIVE
Ketones, ur: NEGATIVE mg/dL
Leukocytes,Ua: NEGATIVE
Nitrite: NEGATIVE
Protein, ur: NEGATIVE mg/dL
Specific Gravity, Urine: 1.017 (ref 1.005–1.030)
pH: 5 (ref 5.0–8.0)

## 2024-03-07 LAB — TROPONIN I (HIGH SENSITIVITY)
Troponin I (High Sensitivity): 3 ng/L (ref <18)
Troponin I (High Sensitivity): 3 ng/L (ref <18)

## 2024-03-07 LAB — CK: Total CK: 48 U/L (ref 38–234)

## 2024-03-07 LAB — COMPREHENSIVE METABOLIC PANEL WITH GFR
ALT: 22 U/L (ref 0–44)
AST: 25 U/L (ref 15–41)
Albumin: 3.6 g/dL (ref 3.5–5.0)
Alkaline Phosphatase: 66 U/L (ref 38–126)
Anion gap: 11 (ref 5–15)
BUN: 9 mg/dL (ref 6–20)
CO2: 21 mmol/L — ABNORMAL LOW (ref 22–32)
Calcium: 8.9 mg/dL (ref 8.9–10.3)
Chloride: 106 mmol/L (ref 98–111)
Creatinine, Ser: 0.79 mg/dL (ref 0.44–1.00)
GFR, Estimated: >60 mL/min (ref >60)
Glucose, Bld: 106 mg/dL — ABNORMAL HIGH (ref 70–99)
Potassium: 3.3 mmol/L — ABNORMAL LOW (ref 3.5–5.1)
Sodium: 138 mmol/L (ref 135–145)
Total Bilirubin: 0.4 mg/dL (ref 0.0–1.2)
Total Protein: 6 g/dL — ABNORMAL LOW (ref 6.5–8.1)

## 2024-03-07 LAB — RAPID HIV SCREEN (HIV 1/2 AB+AG)
HIV 1/2 Antibodies: REACTIVE — AB
HIV-1 P24 Antigen - HIV24: NONREACTIVE

## 2024-03-07 LAB — RESP PANEL BY RT-PCR (RSV, FLU A&B, COVID)  RVPGX2
Influenza A by PCR: NEGATIVE
Influenza B by PCR: NEGATIVE
Resp Syncytial Virus by PCR: NEGATIVE
SARS Coronavirus 2 by RT PCR: NEGATIVE

## 2024-03-07 LAB — PROTIME-INR
INR: 0.9 (ref 0.8–1.2)
Prothrombin Time: 12.7 s (ref 11.4–15.2)

## 2024-03-07 LAB — I-STAT CG4 LACTIC ACID, ED
Lactic Acid, Venous: 0.5 mmol/L (ref 0.5–1.9)
Lactic Acid, Venous: 0.6 mmol/L (ref 0.5–1.9)

## 2024-03-07 LAB — T4, FREE: Free T4: 0.97 ng/dL (ref 0.61–1.12)

## 2024-03-07 LAB — C-REACTIVE PROTEIN: CRP: 0.7 mg/dL (ref <1.0)

## 2024-03-07 LAB — MAGNESIUM: Magnesium: 2.9 mg/dL — ABNORMAL HIGH (ref 1.7–2.4)

## 2024-03-07 LAB — SEDIMENTATION RATE: Sed Rate: 11 mm/h (ref 0–22)

## 2024-03-07 LAB — TSH: TSH: 3.174 u[IU]/mL (ref 0.350–4.500)

## 2024-03-07 LAB — GROUP A STREP BY PCR: Group A Strep by PCR: NOT DETECTED

## 2024-03-07 MED ORDER — POTASSIUM CHLORIDE 10 MEQ/100ML IV SOLN
10.0000 meq | Freq: Once | INTRAVENOUS | Status: AC
  Administered 2024-03-07: 10 meq via INTRAVENOUS
  Filled 2024-03-07: qty 100

## 2024-03-07 MED ORDER — DIAZEPAM 2 MG PO TABS
2.0000 mg | ORAL_TABLET | Freq: Once | ORAL | Status: AC
  Administered 2024-03-07: 2 mg via ORAL
  Filled 2024-03-07: qty 1

## 2024-03-07 MED ORDER — PREDNISONE 10 MG (21) PO TBPK: ORAL_TABLET | Freq: Every day | ORAL | 0 refills | Status: AC

## 2024-03-07 MED ORDER — KETOROLAC TROMETHAMINE 30 MG/ML IJ SOLN
15.0000 mg | Freq: Once | INTRAMUSCULAR | Status: AC
Start: 2024-03-07 — End: 2024-03-07
  Administered 2024-03-07: 15 mg via INTRAVENOUS
  Filled 2024-03-07: qty 1

## 2024-03-07 MED ORDER — MAGNESIUM SULFATE 2 GM/50ML IV SOLN
2.0000 g | Freq: Once | INTRAVENOUS | Status: AC
  Administered 2024-03-07: 2 g via INTRAVENOUS
  Filled 2024-03-07: qty 50

## 2024-03-07 MED ORDER — OXYCODONE-ACETAMINOPHEN 5-325 MG PO TABS
1.0000 | ORAL_TABLET | Freq: Once | ORAL | Status: AC
  Administered 2024-03-07: 1 via ORAL
  Filled 2024-03-07: qty 1

## 2024-03-07 MED ORDER — HYDROXYZINE HCL 25 MG PO TABS
25.0000 mg | ORAL_TABLET | Freq: Once | ORAL | Status: AC
  Administered 2024-03-07: 25 mg via ORAL
  Filled 2024-03-07: qty 1

## 2024-03-07 MED ORDER — LACTATED RINGERS IV BOLUS
1000.0000 mL | Freq: Once | INTRAVENOUS | Status: AC
  Administered 2024-03-07: 1000 mL via INTRAVENOUS

## 2024-03-07 MED ORDER — OXYCODONE-ACETAMINOPHEN 5-325 MG PO TABS: 1.0000 | ORAL_TABLET | Freq: Four times a day (QID) | ORAL | 0 refills | Status: AC | PRN

## 2024-03-07 MED ORDER — METHYLPREDNISOLONE SODIUM SUCC 125 MG IJ SOLR
125.0000 mg | Freq: Once | INTRAMUSCULAR | Status: AC
  Administered 2024-03-07: 125 mg via INTRAVENOUS
  Filled 2024-03-07: qty 2

## 2024-03-07 NOTE — ED Notes (Signed)
 Patient transported to CT scan .

## 2024-03-07 NOTE — Discharge Instructions (Addendum)
 Your screening for HIV antibodies was positive. This does not mean you have HIV, it means you have antibodies against it or something else and can be a false positive. Certain conditions that may cause a person to have a false-positive include:  Epstein-Barr virus autoimmune disorders, such as lupus and rheumatoid arthritis Lyme disease STIs, such as syphilis The following may also cause a false-positive result:  having recently received a vaccination, such as for flu or hepatitis B prior pregnancies receiving multiple blood transfusions taking part in an HIV vaccine study receiving gamma globulin or immunoglobulin   We have sent a follow up confirmatory test and is the real test that should be used to determine status. The other part of that HIV test and all your other lab work do not suggest it either.   Please take the prescribed steroid for the entire course. You have been given a rheumatology referral. Please call their office on  Monday to discuss follow up. Return to the ER with any fevers, chills,  unexplained bruising, sores in your mouth, or any other new severe symptoms.

## 2024-03-08 LAB — RUBELLA ANTIBODY, IGM: Rubella IgM: <20 [AU]/ml (ref 0.0–19.9)

## 2024-03-08 LAB — RUBELLA SCREEN: Rubella: 5.62 {index} (ref >0.99)

## 2024-03-09 LAB — HIV-1/HIV-2 QUALITATIVE RNA
Final Interpretation: NEGATIVE
HIV-1 RNA, Qualitative: NONREACTIVE
HIV-2 RNA, Qualitative: NONREACTIVE

## 2024-03-09 LAB — HIV-1/2 AB - DIFFERENTIATION
HIV 1 Ab: NONREACTIVE
HIV 2 Ab: NONREACTIVE
Note: NEGATIVE

## 2024-03-12 LAB — CULTURE, BLOOD (ROUTINE X 2)
Culture: NO GROWTH
Culture: NO GROWTH
Special Requests: ADEQUATE
Special Requests: ADEQUATE

## 2024-04-14 ENCOUNTER — Encounter: Admitting: Physician Assistant
